# Patient Record
Sex: Female | Born: 1940
Health system: Southern US, Community
[De-identification: ages and names within clinical notes are randomized; demographics above are authoritative.]

## PROBLEM LIST (undated history)

## (undated) DIAGNOSIS — I10 Essential (primary) hypertension: Secondary | ICD-10-CM

## (undated) DIAGNOSIS — E7800 Pure hypercholesterolemia, unspecified: Secondary | ICD-10-CM

## (undated) DIAGNOSIS — I509 Heart failure, unspecified: Secondary | ICD-10-CM

---

## 2001-07-11 ENCOUNTER — Inpatient Hospital Stay (HOSPITAL_COMMUNITY): Admission: EM | Admit: 2001-07-11 | Discharge: 2001-07-12 | Payer: Self-pay | Admitting: Cardiology

## 2011-12-02 DIAGNOSIS — B0222 Postherpetic trigeminal neuralgia: Secondary | ICD-10-CM | POA: Diagnosis not present

## 2011-12-02 DIAGNOSIS — B029 Zoster without complications: Secondary | ICD-10-CM | POA: Diagnosis not present

## 2012-03-01 DIAGNOSIS — B0222 Postherpetic trigeminal neuralgia: Secondary | ICD-10-CM | POA: Diagnosis not present

## 2012-06-04 DIAGNOSIS — B0222 Postherpetic trigeminal neuralgia: Secondary | ICD-10-CM | POA: Diagnosis not present

## 2012-09-06 DIAGNOSIS — B0222 Postherpetic trigeminal neuralgia: Secondary | ICD-10-CM | POA: Diagnosis not present

## 2012-12-24 DIAGNOSIS — B0222 Postherpetic trigeminal neuralgia: Secondary | ICD-10-CM | POA: Diagnosis not present

## 2013-03-25 DIAGNOSIS — E669 Obesity, unspecified: Secondary | ICD-10-CM | POA: Diagnosis not present

## 2013-03-25 DIAGNOSIS — B029 Zoster without complications: Secondary | ICD-10-CM | POA: Diagnosis not present

## 2013-07-01 DIAGNOSIS — E782 Mixed hyperlipidemia: Secondary | ICD-10-CM | POA: Diagnosis not present

## 2013-09-30 DIAGNOSIS — Z0000 Encounter for general adult medical examination without abnormal findings: Secondary | ICD-10-CM | POA: Diagnosis not present

## 2013-09-30 DIAGNOSIS — E782 Mixed hyperlipidemia: Secondary | ICD-10-CM | POA: Diagnosis not present

## 2014-01-02 DIAGNOSIS — E782 Mixed hyperlipidemia: Secondary | ICD-10-CM | POA: Diagnosis not present

## 2014-04-04 DIAGNOSIS — E782 Mixed hyperlipidemia: Secondary | ICD-10-CM | POA: Diagnosis not present

## 2014-07-10 DIAGNOSIS — E782 Mixed hyperlipidemia: Secondary | ICD-10-CM | POA: Diagnosis not present

## 2014-11-10 DIAGNOSIS — Z1389 Encounter for screening for other disorder: Secondary | ICD-10-CM | POA: Diagnosis not present

## 2014-11-10 DIAGNOSIS — E784 Other hyperlipidemia: Secondary | ICD-10-CM | POA: Diagnosis not present

## 2014-11-10 DIAGNOSIS — Z0000 Encounter for general adult medical examination without abnormal findings: Secondary | ICD-10-CM | POA: Diagnosis not present

## 2015-02-12 DIAGNOSIS — E784 Other hyperlipidemia: Secondary | ICD-10-CM | POA: Diagnosis not present

## 2015-05-15 DIAGNOSIS — E784 Other hyperlipidemia: Secondary | ICD-10-CM | POA: Diagnosis not present

## 2015-08-17 DIAGNOSIS — I10 Essential (primary) hypertension: Secondary | ICD-10-CM | POA: Diagnosis not present

## 2015-08-17 DIAGNOSIS — E784 Other hyperlipidemia: Secondary | ICD-10-CM | POA: Diagnosis not present

## 2015-11-19 DIAGNOSIS — Z0000 Encounter for general adult medical examination without abnormal findings: Secondary | ICD-10-CM | POA: Diagnosis not present

## 2015-11-19 DIAGNOSIS — Z1389 Encounter for screening for other disorder: Secondary | ICD-10-CM | POA: Diagnosis not present

## 2015-11-19 DIAGNOSIS — E784 Other hyperlipidemia: Secondary | ICD-10-CM | POA: Diagnosis not present

## 2015-11-19 DIAGNOSIS — I10 Essential (primary) hypertension: Secondary | ICD-10-CM | POA: Diagnosis not present

## 2015-11-19 DIAGNOSIS — Z131 Encounter for screening for diabetes mellitus: Secondary | ICD-10-CM | POA: Diagnosis not present

## 2016-02-18 DIAGNOSIS — E784 Other hyperlipidemia: Secondary | ICD-10-CM | POA: Diagnosis not present

## 2016-02-18 DIAGNOSIS — B0223 Postherpetic polyneuropathy: Secondary | ICD-10-CM | POA: Diagnosis not present

## 2016-02-18 DIAGNOSIS — I10 Essential (primary) hypertension: Secondary | ICD-10-CM | POA: Diagnosis not present

## 2016-05-23 DIAGNOSIS — E784 Other hyperlipidemia: Secondary | ICD-10-CM | POA: Diagnosis not present

## 2016-05-23 DIAGNOSIS — Z131 Encounter for screening for diabetes mellitus: Secondary | ICD-10-CM | POA: Diagnosis not present

## 2016-05-23 DIAGNOSIS — I10 Essential (primary) hypertension: Secondary | ICD-10-CM | POA: Diagnosis not present

## 2016-05-23 DIAGNOSIS — B0223 Postherpetic polyneuropathy: Secondary | ICD-10-CM | POA: Diagnosis not present

## 2016-08-22 DIAGNOSIS — E784 Other hyperlipidemia: Secondary | ICD-10-CM | POA: Diagnosis not present

## 2016-08-22 DIAGNOSIS — B0223 Postherpetic polyneuropathy: Secondary | ICD-10-CM | POA: Diagnosis not present

## 2016-08-22 DIAGNOSIS — I10 Essential (primary) hypertension: Secondary | ICD-10-CM | POA: Diagnosis not present

## 2016-08-22 DIAGNOSIS — N181 Chronic kidney disease, stage 1: Secondary | ICD-10-CM | POA: Diagnosis not present

## 2016-11-24 DIAGNOSIS — I10 Essential (primary) hypertension: Secondary | ICD-10-CM | POA: Diagnosis not present

## 2016-11-24 DIAGNOSIS — E784 Other hyperlipidemia: Secondary | ICD-10-CM | POA: Diagnosis not present

## 2016-11-24 DIAGNOSIS — Z1389 Encounter for screening for other disorder: Secondary | ICD-10-CM | POA: Diagnosis not present

## 2016-11-24 DIAGNOSIS — Z0000 Encounter for general adult medical examination without abnormal findings: Secondary | ICD-10-CM | POA: Diagnosis not present

## 2016-11-24 DIAGNOSIS — B0223 Postherpetic polyneuropathy: Secondary | ICD-10-CM | POA: Diagnosis not present

## 2016-11-24 DIAGNOSIS — N181 Chronic kidney disease, stage 1: Secondary | ICD-10-CM | POA: Diagnosis not present

## 2017-02-23 DIAGNOSIS — B0223 Postherpetic polyneuropathy: Secondary | ICD-10-CM | POA: Diagnosis not present

## 2017-02-23 DIAGNOSIS — N181 Chronic kidney disease, stage 1: Secondary | ICD-10-CM | POA: Diagnosis not present

## 2017-02-23 DIAGNOSIS — E784 Other hyperlipidemia: Secondary | ICD-10-CM | POA: Diagnosis not present

## 2017-02-23 DIAGNOSIS — I10 Essential (primary) hypertension: Secondary | ICD-10-CM | POA: Diagnosis not present

## 2017-05-29 DIAGNOSIS — N181 Chronic kidney disease, stage 1: Secondary | ICD-10-CM | POA: Diagnosis not present

## 2017-05-29 DIAGNOSIS — I10 Essential (primary) hypertension: Secondary | ICD-10-CM | POA: Diagnosis not present

## 2017-05-29 DIAGNOSIS — B0223 Postherpetic polyneuropathy: Secondary | ICD-10-CM | POA: Diagnosis not present

## 2017-05-29 DIAGNOSIS — E784 Other hyperlipidemia: Secondary | ICD-10-CM | POA: Diagnosis not present

## 2017-08-31 DIAGNOSIS — E7849 Other hyperlipidemia: Secondary | ICD-10-CM | POA: Diagnosis not present

## 2017-08-31 DIAGNOSIS — B0223 Postherpetic polyneuropathy: Secondary | ICD-10-CM | POA: Diagnosis not present

## 2017-08-31 DIAGNOSIS — I10 Essential (primary) hypertension: Secondary | ICD-10-CM | POA: Diagnosis not present

## 2017-08-31 DIAGNOSIS — N181 Chronic kidney disease, stage 1: Secondary | ICD-10-CM | POA: Diagnosis not present

## 2017-12-07 DIAGNOSIS — I10 Essential (primary) hypertension: Secondary | ICD-10-CM | POA: Diagnosis not present

## 2017-12-07 DIAGNOSIS — B0223 Postherpetic polyneuropathy: Secondary | ICD-10-CM | POA: Diagnosis not present

## 2017-12-07 DIAGNOSIS — E7849 Other hyperlipidemia: Secondary | ICD-10-CM | POA: Diagnosis not present

## 2017-12-07 DIAGNOSIS — N181 Chronic kidney disease, stage 1: Secondary | ICD-10-CM | POA: Diagnosis not present

## 2018-03-13 DIAGNOSIS — E7849 Other hyperlipidemia: Secondary | ICD-10-CM | POA: Diagnosis not present

## 2018-03-13 DIAGNOSIS — B0223 Postherpetic polyneuropathy: Secondary | ICD-10-CM | POA: Diagnosis not present

## 2018-03-13 DIAGNOSIS — I10 Essential (primary) hypertension: Secondary | ICD-10-CM | POA: Diagnosis not present

## 2018-03-13 DIAGNOSIS — N181 Chronic kidney disease, stage 1: Secondary | ICD-10-CM | POA: Diagnosis not present

## 2018-06-27 DIAGNOSIS — E7849 Other hyperlipidemia: Secondary | ICD-10-CM | POA: Diagnosis not present

## 2018-06-27 DIAGNOSIS — N181 Chronic kidney disease, stage 1: Secondary | ICD-10-CM | POA: Diagnosis not present

## 2018-06-27 DIAGNOSIS — Z0000 Encounter for general adult medical examination without abnormal findings: Secondary | ICD-10-CM | POA: Diagnosis not present

## 2018-06-27 DIAGNOSIS — I10 Essential (primary) hypertension: Secondary | ICD-10-CM | POA: Diagnosis not present

## 2018-06-27 DIAGNOSIS — Z1389 Encounter for screening for other disorder: Secondary | ICD-10-CM | POA: Diagnosis not present

## 2018-10-01 DIAGNOSIS — E7849 Other hyperlipidemia: Secondary | ICD-10-CM | POA: Diagnosis not present

## 2018-10-01 DIAGNOSIS — Z1389 Encounter for screening for other disorder: Secondary | ICD-10-CM | POA: Diagnosis not present

## 2018-10-01 DIAGNOSIS — N181 Chronic kidney disease, stage 1: Secondary | ICD-10-CM | POA: Diagnosis not present

## 2018-10-01 DIAGNOSIS — I10 Essential (primary) hypertension: Secondary | ICD-10-CM | POA: Diagnosis not present

## 2018-10-01 DIAGNOSIS — Z0000 Encounter for general adult medical examination without abnormal findings: Secondary | ICD-10-CM | POA: Diagnosis not present

## 2018-10-26 DIAGNOSIS — W19XXXA Unspecified fall, initial encounter: Secondary | ICD-10-CM | POA: Diagnosis not present

## 2018-10-26 DIAGNOSIS — R531 Weakness: Secondary | ICD-10-CM | POA: Diagnosis not present

## 2018-10-26 DIAGNOSIS — R918 Other nonspecific abnormal finding of lung field: Secondary | ICD-10-CM | POA: Diagnosis not present

## 2018-10-26 DIAGNOSIS — J189 Pneumonia, unspecified organism: Secondary | ICD-10-CM | POA: Diagnosis not present

## 2018-10-26 DIAGNOSIS — R509 Fever, unspecified: Secondary | ICD-10-CM | POA: Diagnosis not present

## 2018-10-26 DIAGNOSIS — R000 Tachycardia, unspecified: Secondary | ICD-10-CM | POA: Diagnosis not present

## 2018-10-26 DIAGNOSIS — T699XXA Effect of reduced temperature, unspecified, initial encounter: Secondary | ICD-10-CM | POA: Diagnosis not present

## 2018-10-27 DIAGNOSIS — I110 Hypertensive heart disease with heart failure: Secondary | ICD-10-CM | POA: Diagnosis not present

## 2018-10-27 DIAGNOSIS — N179 Acute kidney failure, unspecified: Secondary | ICD-10-CM | POA: Diagnosis not present

## 2018-10-27 DIAGNOSIS — G629 Polyneuropathy, unspecified: Secondary | ICD-10-CM | POA: Diagnosis present

## 2018-10-27 DIAGNOSIS — R918 Other nonspecific abnormal finding of lung field: Secondary | ICD-10-CM | POA: Diagnosis not present

## 2018-10-27 DIAGNOSIS — N390 Urinary tract infection, site not specified: Secondary | ICD-10-CM | POA: Diagnosis not present

## 2018-10-27 DIAGNOSIS — Z780 Asymptomatic menopausal state: Secondary | ICD-10-CM | POA: Diagnosis not present

## 2018-10-27 DIAGNOSIS — J1529 Pneumonia due to other staphylococcus: Secondary | ICD-10-CM | POA: Diagnosis not present

## 2018-10-27 DIAGNOSIS — J90 Pleural effusion, not elsewhere classified: Secondary | ICD-10-CM | POA: Diagnosis not present

## 2018-10-27 DIAGNOSIS — I5031 Acute diastolic (congestive) heart failure: Secondary | ICD-10-CM | POA: Diagnosis not present

## 2018-10-27 DIAGNOSIS — Z79899 Other long term (current) drug therapy: Secondary | ICD-10-CM | POA: Diagnosis not present

## 2018-10-27 DIAGNOSIS — Z6837 Body mass index (BMI) 37.0-37.9, adult: Secondary | ICD-10-CM | POA: Diagnosis not present

## 2018-10-27 DIAGNOSIS — J189 Pneumonia, unspecified organism: Secondary | ICD-10-CM | POA: Diagnosis not present

## 2018-10-27 DIAGNOSIS — W19XXXA Unspecified fall, initial encounter: Secondary | ICD-10-CM | POA: Diagnosis not present

## 2018-10-27 DIAGNOSIS — E871 Hypo-osmolality and hyponatremia: Secondary | ICD-10-CM | POA: Diagnosis not present

## 2018-10-27 DIAGNOSIS — R531 Weakness: Secondary | ICD-10-CM | POA: Diagnosis present

## 2018-10-27 DIAGNOSIS — E785 Hyperlipidemia, unspecified: Secondary | ICD-10-CM | POA: Diagnosis present

## 2018-11-08 DIAGNOSIS — I110 Hypertensive heart disease with heart failure: Secondary | ICD-10-CM | POA: Diagnosis not present

## 2018-11-08 DIAGNOSIS — Z7982 Long term (current) use of aspirin: Secondary | ICD-10-CM | POA: Diagnosis not present

## 2018-11-08 DIAGNOSIS — I509 Heart failure, unspecified: Secondary | ICD-10-CM | POA: Diagnosis not present

## 2018-11-08 DIAGNOSIS — I5031 Acute diastolic (congestive) heart failure: Secondary | ICD-10-CM | POA: Diagnosis not present

## 2018-11-08 DIAGNOSIS — M6281 Muscle weakness (generalized): Secondary | ICD-10-CM | POA: Diagnosis not present

## 2018-11-08 DIAGNOSIS — N390 Urinary tract infection, site not specified: Secondary | ICD-10-CM | POA: Diagnosis not present

## 2018-11-08 DIAGNOSIS — J158 Pneumonia due to other specified bacteria: Secondary | ICD-10-CM | POA: Diagnosis not present

## 2018-11-08 DIAGNOSIS — J1520 Pneumonia due to staphylococcus, unspecified: Secondary | ICD-10-CM | POA: Diagnosis not present

## 2018-11-12 DIAGNOSIS — M6281 Muscle weakness (generalized): Secondary | ICD-10-CM | POA: Diagnosis not present

## 2018-11-12 DIAGNOSIS — I110 Hypertensive heart disease with heart failure: Secondary | ICD-10-CM | POA: Diagnosis not present

## 2018-11-12 DIAGNOSIS — N390 Urinary tract infection, site not specified: Secondary | ICD-10-CM | POA: Diagnosis not present

## 2018-11-12 DIAGNOSIS — J1520 Pneumonia due to staphylococcus, unspecified: Secondary | ICD-10-CM | POA: Diagnosis not present

## 2018-11-12 DIAGNOSIS — I509 Heart failure, unspecified: Secondary | ICD-10-CM | POA: Diagnosis not present

## 2018-11-12 DIAGNOSIS — Z7982 Long term (current) use of aspirin: Secondary | ICD-10-CM | POA: Diagnosis not present

## 2018-11-14 DIAGNOSIS — I509 Heart failure, unspecified: Secondary | ICD-10-CM | POA: Diagnosis not present

## 2018-11-14 DIAGNOSIS — M6281 Muscle weakness (generalized): Secondary | ICD-10-CM | POA: Diagnosis not present

## 2018-11-14 DIAGNOSIS — Z7982 Long term (current) use of aspirin: Secondary | ICD-10-CM | POA: Diagnosis not present

## 2018-11-14 DIAGNOSIS — N390 Urinary tract infection, site not specified: Secondary | ICD-10-CM | POA: Diagnosis not present

## 2018-11-14 DIAGNOSIS — J1520 Pneumonia due to staphylococcus, unspecified: Secondary | ICD-10-CM | POA: Diagnosis not present

## 2018-11-14 DIAGNOSIS — I110 Hypertensive heart disease with heart failure: Secondary | ICD-10-CM | POA: Diagnosis not present

## 2018-11-19 DIAGNOSIS — J1520 Pneumonia due to staphylococcus, unspecified: Secondary | ICD-10-CM | POA: Diagnosis not present

## 2018-11-19 DIAGNOSIS — I110 Hypertensive heart disease with heart failure: Secondary | ICD-10-CM | POA: Diagnosis not present

## 2018-11-19 DIAGNOSIS — N390 Urinary tract infection, site not specified: Secondary | ICD-10-CM | POA: Diagnosis not present

## 2018-11-19 DIAGNOSIS — I509 Heart failure, unspecified: Secondary | ICD-10-CM | POA: Diagnosis not present

## 2018-11-19 DIAGNOSIS — Z7982 Long term (current) use of aspirin: Secondary | ICD-10-CM | POA: Diagnosis not present

## 2018-11-19 DIAGNOSIS — M6281 Muscle weakness (generalized): Secondary | ICD-10-CM | POA: Diagnosis not present

## 2018-11-22 DIAGNOSIS — J1520 Pneumonia due to staphylococcus, unspecified: Secondary | ICD-10-CM | POA: Diagnosis not present

## 2018-11-22 DIAGNOSIS — Z7982 Long term (current) use of aspirin: Secondary | ICD-10-CM | POA: Diagnosis not present

## 2018-11-22 DIAGNOSIS — M6281 Muscle weakness (generalized): Secondary | ICD-10-CM | POA: Diagnosis not present

## 2018-11-22 DIAGNOSIS — I110 Hypertensive heart disease with heart failure: Secondary | ICD-10-CM | POA: Diagnosis not present

## 2018-11-22 DIAGNOSIS — I509 Heart failure, unspecified: Secondary | ICD-10-CM | POA: Diagnosis not present

## 2018-11-22 DIAGNOSIS — N390 Urinary tract infection, site not specified: Secondary | ICD-10-CM | POA: Diagnosis not present

## 2018-11-30 DIAGNOSIS — R0689 Other abnormalities of breathing: Secondary | ICD-10-CM | POA: Diagnosis not present

## 2018-11-30 DIAGNOSIS — R0902 Hypoxemia: Secondary | ICD-10-CM | POA: Diagnosis not present

## 2018-11-30 DIAGNOSIS — I959 Hypotension, unspecified: Secondary | ICD-10-CM | POA: Diagnosis not present

## 2018-11-30 DIAGNOSIS — R000 Tachycardia, unspecified: Secondary | ICD-10-CM | POA: Diagnosis not present

## 2018-11-30 DIAGNOSIS — R55 Syncope and collapse: Secondary | ICD-10-CM | POA: Diagnosis not present

## 2018-12-01 ENCOUNTER — Other Ambulatory Visit: Payer: Self-pay

## 2018-12-01 ENCOUNTER — Observation Stay (HOSPITAL_COMMUNITY): Payer: Medicare Other

## 2018-12-01 ENCOUNTER — Encounter (HOSPITAL_COMMUNITY): Payer: Self-pay

## 2018-12-01 ENCOUNTER — Observation Stay (HOSPITAL_COMMUNITY)
Admission: EM | Admit: 2018-12-01 | Discharge: 2018-12-02 | Disposition: A | Discharge status: Home / Self Care | Payer: Medicare Other | Attending: Internal Medicine | Admitting: Internal Medicine

## 2018-12-01 ENCOUNTER — Emergency Department (HOSPITAL_COMMUNITY): Payer: Medicare Other

## 2018-12-01 DIAGNOSIS — R55 Syncope and collapse: Secondary | ICD-10-CM | POA: Diagnosis not present

## 2018-12-01 DIAGNOSIS — Z7982 Long term (current) use of aspirin: Secondary | ICD-10-CM | POA: Diagnosis not present

## 2018-12-01 DIAGNOSIS — Z79899 Other long term (current) drug therapy: Secondary | ICD-10-CM | POA: Insufficient documentation

## 2018-12-01 DIAGNOSIS — N179 Acute kidney failure, unspecified: Secondary | ICD-10-CM | POA: Diagnosis present

## 2018-12-01 DIAGNOSIS — E860 Dehydration: Secondary | ICD-10-CM | POA: Insufficient documentation

## 2018-12-01 DIAGNOSIS — I110 Hypertensive heart disease with heart failure: Secondary | ICD-10-CM | POA: Diagnosis not present

## 2018-12-01 DIAGNOSIS — E785 Hyperlipidemia, unspecified: Secondary | ICD-10-CM | POA: Diagnosis not present

## 2018-12-01 DIAGNOSIS — I10 Essential (primary) hypertension: Secondary | ICD-10-CM | POA: Diagnosis present

## 2018-12-01 DIAGNOSIS — E875 Hyperkalemia: Secondary | ICD-10-CM | POA: Diagnosis not present

## 2018-12-01 DIAGNOSIS — I509 Heart failure, unspecified: Secondary | ICD-10-CM | POA: Insufficient documentation

## 2018-12-01 DIAGNOSIS — E7800 Pure hypercholesterolemia, unspecified: Secondary | ICD-10-CM | POA: Insufficient documentation

## 2018-12-01 DIAGNOSIS — R1110 Vomiting, unspecified: Secondary | ICD-10-CM | POA: Diagnosis present

## 2018-12-01 DIAGNOSIS — I959 Hypotension, unspecified: Secondary | ICD-10-CM | POA: Diagnosis not present

## 2018-12-01 DIAGNOSIS — R2681 Unsteadiness on feet: Secondary | ICD-10-CM | POA: Diagnosis not present

## 2018-12-01 DIAGNOSIS — Z23 Encounter for immunization: Secondary | ICD-10-CM | POA: Diagnosis not present

## 2018-12-01 HISTORY — DX: Essential (primary) hypertension: I10

## 2018-12-01 HISTORY — DX: Pure hypercholesterolemia, unspecified: E78.00

## 2018-12-01 HISTORY — DX: Heart failure, unspecified: I50.9

## 2018-12-01 LAB — URINALYSIS, ROUTINE W REFLEX MICROSCOPIC
Bilirubin Urine: NEGATIVE
Glucose, UA: NEGATIVE mg/dL
Ketones, ur: NEGATIVE mg/dL
Nitrite: NEGATIVE
Protein, ur: NEGATIVE mg/dL
Specific Gravity, Urine: 1.005 (ref 1.005–1.030)
Trans Epithel, UA: 2
pH: 5 (ref 5.0–8.0)

## 2018-12-01 LAB — COMPREHENSIVE METABOLIC PANEL
ALK PHOS: 103 U/L (ref 38–126)
ALT: 18 U/L (ref 0–44)
AST: 46 U/L — ABNORMAL HIGH (ref 15–41)
Albumin: 3.1 g/dL — ABNORMAL LOW (ref 3.5–5.0)
Anion gap: 13 (ref 5–15)
BUN: 49 mg/dL — ABNORMAL HIGH (ref 8–23)
CO2: 21 mmol/L — ABNORMAL LOW (ref 22–32)
Calcium: 8.6 mg/dL — ABNORMAL LOW (ref 8.9–10.3)
Chloride: 94 mmol/L — ABNORMAL LOW (ref 98–111)
Creatinine, Ser: 3.65 mg/dL — ABNORMAL HIGH (ref 0.44–1.00)
GFR calc Af Amer: 13 mL/min — ABNORMAL LOW (ref >60)
GFR calc non Af Amer: 11 mL/min — ABNORMAL LOW (ref >60)
Glucose, Bld: 115 mg/dL — ABNORMAL HIGH (ref 70–99)
Potassium: 6.2 mmol/L — ABNORMAL HIGH (ref 3.5–5.1)
Sodium: 128 mmol/L — ABNORMAL LOW (ref 135–145)
Total Bilirubin: 1.6 mg/dL — ABNORMAL HIGH (ref 0.3–1.2)
Total Protein: 6.5 g/dL (ref 6.5–8.1)

## 2018-12-01 LAB — BASIC METABOLIC PANEL
Anion gap: 10 (ref 5–15)
BUN: 45 mg/dL — ABNORMAL HIGH (ref 8–23)
CO2: 19 mmol/L — ABNORMAL LOW (ref 22–32)
Calcium: 8.1 mg/dL — ABNORMAL LOW (ref 8.9–10.3)
Chloride: 101 mmol/L (ref 98–111)
Creatinine, Ser: 3.28 mg/dL — ABNORMAL HIGH (ref 0.44–1.00)
GFR calc Af Amer: 15 mL/min — ABNORMAL LOW (ref >60)
GFR calc non Af Amer: 13 mL/min — ABNORMAL LOW (ref >60)
GLUCOSE: 100 mg/dL — AB (ref 70–99)
Potassium: 5.9 mmol/L — ABNORMAL HIGH (ref 3.5–5.1)
Sodium: 130 mmol/L — ABNORMAL LOW (ref 135–145)

## 2018-12-01 LAB — CBC WITH DIFFERENTIAL/PLATELET
ABS IMMATURE GRANULOCYTES: 0.05 10*3/uL (ref 0.00–0.07)
Basophils Absolute: 0 10*3/uL (ref 0.0–0.1)
Basophils Relative: 0 %
Eosinophils Absolute: 0.4 10*3/uL (ref 0.0–0.5)
Eosinophils Relative: 6 %
HCT: 33.3 % — ABNORMAL LOW (ref 36.0–46.0)
Hemoglobin: 10.7 g/dL — ABNORMAL LOW (ref 12.0–15.0)
Immature Granulocytes: 1 %
Lymphocytes Relative: 15 %
Lymphs Abs: 1.1 10*3/uL (ref 0.7–4.0)
MCH: 29.4 pg (ref 26.0–34.0)
MCHC: 32.1 g/dL (ref 30.0–36.0)
MCV: 91.5 fL (ref 80.0–100.0)
MONO ABS: 0.8 10*3/uL (ref 0.1–1.0)
Monocytes Relative: 11 %
NEUTROS ABS: 4.7 10*3/uL (ref 1.7–7.7)
Neutrophils Relative %: 67 %
Platelets: 231 10*3/uL (ref 150–400)
RBC: 3.64 MIL/uL — ABNORMAL LOW (ref 3.87–5.11)
RDW: 13.3 % (ref 11.5–15.5)
WBC: 7.1 10*3/uL (ref 4.0–10.5)
nRBC: 0 % (ref 0.0–0.2)

## 2018-12-01 LAB — I-STAT TROPONIN, ED: Troponin i, poc: 0.02 ng/mL (ref 0.00–0.08)

## 2018-12-01 LAB — POTASSIUM: Potassium: 5.6 mmol/L — ABNORMAL HIGH (ref 3.5–5.1)

## 2018-12-01 MED ORDER — INFLUENZA VAC SPLIT HIGH-DOSE 0.5 ML IM SUSY
0.5000 mL | PREFILLED_SYRINGE | INTRAMUSCULAR | Status: AC
  Administered 2018-12-02: 0.5 mL via INTRAMUSCULAR
  Filled 2018-12-01: qty 0.5

## 2018-12-01 MED ORDER — POLYETHYLENE GLYCOL 3350 17 G PO PACK
17.0000 g | PACK | Freq: Two times a day (BID) | ORAL | Status: DC
  Filled 2018-12-01: qty 1

## 2018-12-01 MED ORDER — ATORVASTATIN CALCIUM 10 MG PO TABS
20.0000 mg | ORAL_TABLET | Freq: Every day | ORAL | Status: DC
  Administered 2018-12-01: 20 mg via ORAL
  Filled 2018-12-01: qty 2

## 2018-12-01 MED ORDER — ONDANSETRON HCL 4 MG/2ML IJ SOLN: 4.0000 mg | Freq: Four times a day (QID) | INTRAMUSCULAR | Status: DC | PRN

## 2018-12-01 MED ORDER — ASPIRIN 81 MG PO CHEW
81.0000 mg | CHEWABLE_TABLET | Freq: Every day | ORAL | Status: DC
  Administered 2018-12-01 – 2018-12-02 (×2): 81 mg via ORAL
  Filled 2018-12-01 (×2): qty 1

## 2018-12-01 MED ORDER — NYSTATIN 100000 UNIT/GM EX POWD
Freq: Three times a day (TID) | CUTANEOUS | Status: DC
  Administered 2018-12-01 – 2018-12-02 (×2): via TOPICAL
  Filled 2018-12-01: qty 15

## 2018-12-01 MED ORDER — HEPARIN SODIUM (PORCINE) 5000 UNIT/ML IJ SOLN
5000.0000 [IU] | Freq: Three times a day (TID) | INTRAMUSCULAR | Status: DC
  Administered 2018-12-01 – 2018-12-02 (×3): 5000 [IU] via SUBCUTANEOUS
  Filled 2018-12-01 (×3): qty 1

## 2018-12-01 MED ORDER — SODIUM POLYSTYRENE SULFONATE 15 GM/60ML PO SUSP
30.0000 g | Freq: Once | ORAL | Status: AC
  Administered 2018-12-01: 30 g via ORAL
  Filled 2018-12-01: qty 120

## 2018-12-01 MED ORDER — SODIUM CHLORIDE 0.9 % IV SOLN
INTRAVENOUS | Status: DC
  Administered 2018-12-01: 07:00:00 via INTRAVENOUS

## 2018-12-01 MED ORDER — LACTATED RINGERS IV SOLN
INTRAVENOUS | Status: AC
  Administered 2018-12-01 (×2): via INTRAVENOUS

## 2018-12-01 MED ORDER — ACETAMINOPHEN 325 MG PO TABS: 650.0000 mg | ORAL_TABLET | Freq: Four times a day (QID) | ORAL | Status: DC | PRN

## 2018-12-01 MED ORDER — PNEUMOCOCCAL VAC POLYVALENT 25 MCG/0.5ML IJ INJ
0.5000 mL | INJECTION | INTRAMUSCULAR | Status: AC
  Administered 2018-12-02: 0.5 mL via INTRAMUSCULAR
  Filled 2018-12-01: qty 0.5

## 2018-12-01 MED ORDER — ALBUTEROL SULFATE (2.5 MG/3ML) 0.083% IN NEBU
5.0000 mg | INHALATION_SOLUTION | Freq: Once | RESPIRATORY_TRACT | Status: AC
  Administered 2018-12-01: 5 mg via RESPIRATORY_TRACT
  Filled 2018-12-01: qty 6

## 2018-12-01 MED ORDER — CARVEDILOL 3.125 MG PO TABS
3.1250 mg | ORAL_TABLET | Freq: Two times a day (BID) | ORAL | Status: DC
  Administered 2018-12-01 – 2018-12-02 (×3): 3.125 mg via ORAL
  Filled 2018-12-01 (×4): qty 1

## 2018-12-01 MED ORDER — SODIUM CHLORIDE 0.9 % IV BOLUS (SEPSIS)
500.0000 mL | Freq: Once | INTRAVENOUS | Status: AC
  Administered 2018-12-01: 500 mL via INTRAVENOUS

## 2018-12-01 MED ORDER — IPRATROPIUM-ALBUTEROL 0.5-2.5 (3) MG/3ML IN SOLN: 3.0000 mL | Freq: Four times a day (QID) | RESPIRATORY_TRACT | Status: DC | PRN

## 2018-12-01 MED ORDER — SODIUM CHLORIDE 0.9 % IV BOLUS (SEPSIS)
1000.0000 mL | Freq: Once | INTRAVENOUS | Status: AC
  Administered 2018-12-01: 1000 mL via INTRAVENOUS

## 2018-12-01 NOTE — ED Notes (Signed)
Patient transported to Ultrasound 

## 2018-12-01 NOTE — ED Notes (Signed)
Patient had another large bowel movement. She is refusing her Miralax at this moment

## 2018-12-01 NOTE — ED Notes (Signed)
Patient ambulated to the bathroom-standby assist

## 2018-12-01 NOTE — ED Notes (Signed)
Patient had a large  bowel movement

## 2018-12-01 NOTE — ED Notes (Signed)
Tabitha (SR)Lunch Tray Ordered @ 0956-per RN-called by Nimrod Wendt  

## 2018-12-01 NOTE — Progress Notes (Signed)
New admission arrived to the unit, placed on telemetry. Patient is alert and oriented x4 and ambulatory. Patient lives at home with room mate and used no assistive devices. Orthostatics performed on admission to the unit and patient was positive.  Small skin rash to left lower abdomen and mid back.. Patient also has redness present under both breast. Nystatin powder ordered. Patient has IVF infusing. She was explained unit protocol and patient handout/ welcome kit received. Bed alarm set R/T +orthostatics, Call bell within reach and room mate at beside

## 2018-12-01 NOTE — ED Triage Notes (Signed)
Pt BIB [facility] d/t witnessed syncopal episode in chair. Pt was hypOtensive 78/40 by EMS. EMS gave 150 cc NSB. Pt hx Pneumonia last month

## 2018-12-01 NOTE — ED Provider Notes (Signed)
TIME SEEN: 1:16 AM  CHIEF COMPLAINT: Syncope  HPI: Patient is a 78 year old female with history of hypertension who presents to the emergency department with a syncopal event that occurred just prior to arrival.  Per her roommate, patient had an episode of unresponsiveness while sitting in her chair tonight.  This lasted for approximately 5 minutes with no seizure activity.  Patient states that she could hear her roommate talking to her and thought she was responding but the roommate said she never open her eyes, follow commands or answer questions.  No incontinence or tongue biting.  Patient denies any recent fevers, chest pain, shortness of breath, cough, diarrhea, bloody stools or melena.  Did have one episode of vomiting tonight.  States she was recently admitted to [facility] the beginning of January for pneumonia.  Was found to be hypotensive with EMS with systolic blood pressures in the 70s.  They have been giving IV fluids in route.  ROS: See HPI Constitutional: no fever  Eyes: no drainage  ENT: no runny nose   Cardiovascular:  no chest pain  Resp: no SOB  GI: no vomiting GU: no dysuria Integumentary: no rash  Allergy: no hives  Musculoskeletal: no leg swelling  Neurological: no slurred speech ROS otherwise negative  PAST MEDICAL HISTORY/PAST SURGICAL HISTORY:  HTN  MEDICATIONS:  Prior to Admission medications   Not on File    ALLERGIES:  Not on File  SOCIAL HISTORY:  Social History   Tobacco Use  . Smoking status: Never Smoker  . Smokeless tobacco: Never Used  Substance Use Topics  . Alcohol use: Never    Frequency: Never    FAMILY HISTORY: History reviewed. No pertinent family history.  EXAM: BP (!) 106/45 (BP Location: Left Arm)   Pulse 68   Temp (!) 97.3 F (36.3 C) (Oral)   Resp 14   Ht 5' (1.524 m)   Wt 78.9 kg   SpO2 95%   BMI 33.98 kg/m  CONSTITUTIONAL: Alert and oriented x 3 and responds appropriately to questions. Well-appearing;  well-nourished, elderly HEAD: Normocephalic EYES: Conjunctivae clear, pupils appear equal, EOMI ENT: normal nose; moist mucous membranes NECK: Supple, no meningismus, no nuchal rigidity, no LAD  CARD: RRR; S1 and S2 appreciated; no murmurs, no clicks, no rubs, no gallops RESP: Normal chest excursion without splinting or tachypnea; breath sounds clear and equal bilaterally; no wheezes, no rhonchi, no rales, no hypoxia or respiratory distress, speaking full sentences ABD/GI: Normal bowel sounds; non-distended; soft, non-tender, no rebound, no guarding, no peritoneal signs, no hepatosplenomegaly BACK:  The back appears normal and is non-tender to palpation, there is no CVA tenderness EXT: Normal ROM in all joints; non-tender to palpation; no edema; normal capillary refill; no cyanosis, no calf tenderness or swelling    SKIN: Normal color for age and race; warm; no rash NEURO: Moves all extremities equally, sensation to light touch intact diffusely, cranial nerves II through XII intact, normal speech PSYCH: The patient's mood and manner are appropriate. Grooming and personal hygiene are appropriate.  MEDICAL DECISION MAKING: Patient here with a syncopal event.  Patient states that she does not think she lost consciousness fully.  Roommate at bedside denies any seizure-like activity.  She is neurologically intact.  She denies any complaints at this time.  No recent infectious symptoms.  Did have one episode of vomiting but otherwise no significant vomiting, diarrhea.  Has been eating and drinking well.  Was hypotensive with EMS but this has improved.  Will continue  IV hydration.  Will check labs for anemia, electrolyte disturbance, ACS.  EKG shows no ischemic abnormality.  Will check urinalysis, chest x-ray to evaluate for signs of infection.  ED PROGRESS: Patient's labs show creatinine of 3.65.  She denies a known history of renal failure.  I suspect that this is new.  Her potassium is 5.6 without EKG  changes.  She is getting IV fluids and will also give albuterol to temporize her hyperkalemia.  Chest x-ray shows no sign of pneumonia.  Urine shows no sign of infection.  Hemoglobin 0.7.  Troponin negative.  I feel patient will need admission.   4:29 AM Discussed patient's case with hospitalist, Dr. Julian Reil.  I have recommended admission and patient (and family if present) agree with this plan. Admitting physician will place admission orders.   I reviewed all nursing notes, vitals, pertinent previous records, EKGs, lab and urine results, imaging (as available).      EKG Interpretation  Date/Time:  Saturday December 01 2018 00:53:17 EST Ventricular Rate:  70 PR Interval:    QRS Duration: 105 QT Interval:  423 QTC Calculation: 457 R Axis:   -13 Text Interpretation:  Sinus rhythm Probable anterior infarct, age indeterminate No significant change since last tracing Confirmed by Benjamim Harnish, Baxter Hire [phone]) on 12/01/2018 4:12:28 AM        CRITICAL CARE Performed by: Baxter Hire Haniya Fern   Total critical care time: 55 minutes  Critical care time was exclusive of separately billable procedures and treating other patients.  Critical care was necessary to treat or prevent imminent or life-threatening deterioration.  Critical care was time spent personally by me on the following activities: development of treatment plan with patient and/or surrogate as well as nursing, discussions with consultants, evaluation of patient's response to treatment, examination of patient, obtaining history from patient or surrogate, ordering and performing treatments and interventions, ordering and review of laboratory studies, ordering and review of radiographic studies, pulse oximetry and re-evaluation of patient's condition.     Clessie Karras, Layla Maw, DO 12/01/18 [phone]

## 2018-12-01 NOTE — H&P (Signed)
TRH H&P   Patient Demographics:    Kathleen Nielsen, is a 78 y.o. female  MRN: [id number]   DOB - January 01, 1941  Admit Date - 12/01/2018  Outpatient Primary MD None listed    Patient coming from: Home  Chief Complaint  Patient presents with  . Loss of Consciousness      HPI:    Kathleen Nielsen  is a 78 y.o. female, 3 of hypertension on diuretic, ACE inhibitor and Coreg, dyslipidemia who was in usual state of health until last night when her daughter-in-law tried to wake her up and she thought she was more drowsy than usual, she was brought to the ER where she was found to be dehydrated and acute renal failure along with hyperkalemia, we were requested to admit her for further care.  Patient currently is strictly symptom-free denies any headache chest pain cough shortness of breath, no diarrhea or dysuria or focal weakness.  Completely symptom-free.    Review of systems:    A full 10 point Review of Systems was done, except as stated above, all other Review of Systems were negative.   With Past History of the following :    Past Medical History:  Diagnosis Date  . CHF (congestive heart failure) (HCC)   . Hypercholesterolemia   . Hypertension       Past Surgical History:  Procedure Laterality Date  . CESAREAN SECTION     x2      Social History:     Social History   Tobacco Use  . Smoking status: Never Smoker  . Smokeless tobacco: Never Used  Substance Use Topics  . Alcohol use: Never    Frequency: Never         Family History :   Family history of dialysis or kidney failure   Home Medications:   Prior to Admission medications   Medication Sig Start Date End Date Taking? Authorizing Provider    aspirin 81 MG chewable tablet Chew 81 mg by mouth daily.   Yes [provider]  atorvastatin (LIPITOR) 20 MG tablet Take 20 mg by mouth daily at 6 PM.   Yes [provider]  benazepril (LOTENSIN) 20 MG tablet Take 20 mg by mouth daily. 11/02/18  Yes [provider]  carvedilol (COREG) 3.125 MG tablet Take 3.125 mg by mouth 2 (  two) times daily. 11/28/18  Yes [provider]  gabapentin (NEURONTIN) 300 MG capsule Take 300 mg by mouth 2 (two) times daily. 10/01/18  Yes [provider]  potassium chloride (K-DUR,KLOR-CON) 10 MEQ tablet Take 10 mEq by mouth daily. 11/22/18  Yes [provider]  torsemide (DEMADEX) 5 MG tablet Take 20 mg by mouth daily. 11/07/18  Yes [provider]     Allergies:    No Known Allergies   Physical Exam:   Vitals  Blood pressure 116/61, pulse 88, temperature (!) 97.3 F (36.3 C), temperature source Oral, resp. rate 18, height 5' (1.524 m), weight 78.9 kg, SpO2 99 %.   1. General elderly white Caucasian female lying in hospital bed in no apparent discomfort,  2. Normal affect and insight, Not Suicidal or Homicidal, Awake Alert, Oriented X 3.  3. No F.N deficits, ALL C.Nerves Intact, Strength 5/5 all 4 extremities, Sensation intact all 4 extremities, Plantars down going.  4. Ears and Eyes appear Normal, Conjunctivae clear, PERRLA. Moist Oral Mucosa.  5. Supple Neck, No JVD, No cervical lymphadenopathy appriciated, No Carotid Bruits.  6. Symmetrical Chest wall movement, Good air movement bilaterally, CTAB.  7. RRR, No Gallops, Rubs or Murmurs, No Parasternal Heave.  8. Positive Bowel Sounds, Abdomen Soft, No tenderness, No organomegaly appriciated,No rebound -guarding or rigidity.  9.  No Cyanosis, Normal Skin Turgor, No Skin Rash or Bruise.  10. Good muscle tone,  joints appear normal , no effusions, Normal ROM.  11. No Palpable Lymph Nodes in Neck or Axillae      Data Review:     CBC Recent Labs  Lab 12/01/18 0115  WBC 7.1  HGB 10.7*  HCT 33.3*  PLT 231  MCV 91.5  MCH 29.4  MCHC 32.1  RDW 13.3  LYMPHSABS 1.1  MONOABS 0.8  EOSABS 0.4  BASOSABS 0.0   ------------------------------------------------------------------------------------------------------------------  Chemistries  Recent Labs  Lab 12/01/18 0115 12/01/18 0227 12/01/18 0659  NA 128*  --  130*  K 6.2* 5.6* 5.9*  CL 94*  --  101  CO2 21*  --  19*  GLUCOSE 115*  --  100*  BUN 49*  --  45*  CREATININE 3.65*  --  3.28*  CALCIUM 8.6*  --  8.1*  AST 46*  --   --   ALT 18  --   --   ALKPHOS 103  --   --   BILITOT 1.6*  --   --    ------------------------------------------------------------------------------------------------------------------ estimated creatinine clearance is 13.1 mL/min (A) (by C-G formula based on SCr of 3.28 mg/dL (H)). ------------------------------------------------------------------------------------------------------------------ No results for input(s): TSH, T4TOTAL, T3FREE, THYROIDAB in the last 72 hours.  Invalid input(s): FREET3  Coagulation profile No results for input(s): INR, PROTIME in the last 168 hours. ------------------------------------------------------------------------------------------------------------------- No results for input(s): DDIMER in the last 72 hours. -------------------------------------------------------------------------------------------------------------------  Cardiac Enzymes No results for input(s): CKMB, TROPONINI, MYOGLOBIN in the last 168 hours.  Invalid input(s): CK ------------------------------------------------------------------------------------------------------------------ No results found for: BNP   ---------------------------------------------------------------------------------------------------------------  Urinalysis    Component Value Date/Time   COLORURINE STRAW (A) 12/01/2018 0354   APPEARANCEUR CLEAR  12/01/2018 0354   LABSPEC 1.005 12/01/2018 0354   PHURINE 5.0 12/01/2018 0354   GLUCOSEU NEGATIVE 12/01/2018 0354   HGBUR SMALL (A) 12/01/2018 0354   BILIRUBINUR NEGATIVE 12/01/2018 0354   KETONESUR NEGATIVE 12/01/2018 0354   PROTEINUR NEGATIVE 12/01/2018 0354   NITRITE NEGATIVE 12/01/2018 0354   LEUKOCYTESUR SMALL (A) 12/01/2018 0354    ----------------------------------------------------------------------------------------------------------------  Imaging Results:    Dg Chest 2 View  Result Date: 12/01/2018 CLINICAL DATA:  Syncope, hypotension EXAM: CHEST - 2 VIEW COMPARISON:  11/02/2018, 10/27/2018 FINDINGS: Cardiomegaly. Interstitial prominence throughout the lungs. This could reflect chronic interstitial lung disease or interstitial edema. No visible significant effusions or acute bony abnormality. IMPRESSION: Cardiomegaly. Diffuse interstitial prominence throughout the lungs, stable dating back to 10/27/2018. This could reflect chronic interstitial lung disease/fibrosis or interstitial edema. Electronically Signed   By: Charlett Nose M.D.   On: 12/01/2018 02:01    My personal review of EKG: Rhythm NSR, no acute ST changes.   Assessment & Plan:      1.  Dehydration induced acute renal failure and hyperkalemia.  Caused by underlying use of diuretics and ACE inhibitor.  Hold both, hydrate, check renal ultrasound, monitor BMP.  Give a dose of Kayexalate.  2.  Hypertension.  Blood pressure stable.  Hold all blood pressure medications hydrate and monitor.  3.  Stable dyslipidemia.  Continue home dose statin.  4. ? history of CHF in the chart.  No echo on file.  Currently dehydrated.  Outpatient follow-up with PCP and primary cardiologist.    DVT Prophylaxis Heparin    AM Labs Ordered, also please review Full Orders  Family Communication: Admission, patients condition and plan of care including tests being ordered have been discussed with the patient and amily who indicate  understanding and agree with the plan and Code Status.  Code Status Full  Likely DC to  Home  Condition GUARDED    Consults called: None    Admission status: Obs    Time spent in minutes : 35   Susa Raring M.D on 12/01/2018 at 12:04 PM  To page go to [email or web address] - password [facility]

## 2018-12-01 NOTE — Evaluation (Addendum)
Physical Therapy Evaluation Patient Details Name: Kathleen Nielsen A Denslow MRN: [id number] DOB: 05-Dec-1940 Today's Date: 12/01/2018   History of Present Illness  Patient is a 78 y/o female who presents with syncopal episode. Admitted with dehydration induced renal failure and hyperkalemia. PMH includes HTN, CHF.   Clinical Impression  Patient presents with mild balance deficits and + orthostasis s/p above. Tolerated bed mobility, transfers and short distance ambulation with close Min guard for safety as pt is orthostatic and asymptomatic. Sitting BP 129/60, standing BP with marching 94/69, asymptomatic. Distance limited due to BP drop and risk of falling out without warning. Getting IV fluid currently, will reassess walking next session. Education re: importance of changing positions slowly and waiting a few mins before walking, safety at home. Pt independent PTA and just finished up HHPT last week. Reports doing daily exercise program. Pt lives with roommate. Encouraged walking while in the hospital. Will follow acutely to maximize independence and mobility prior to return home.     Follow Up Recommendations No PT follow up;Supervision - Intermittent    Equipment Recommendations  None recommended by PT    Recommendations for Other Services       Precautions / Restrictions Precautions Precautions: Fall Precaution Comments: orthostatic Restrictions Weight Bearing Restrictions: No      Mobility  Bed Mobility Overal bed mobility: Needs Assistance Bed Mobility: Supine to Sit     Supine to sit: Supervision;HOB elevated        Transfers Overall transfer level: Needs assistance Equipment used: None Transfers: Sit to/from Stand Sit to Stand: Supervision         General transfer comment: Supervision for safety. Stood from [employer] x1. No dizziness.  Ambulation/Gait Ambulation/Gait assistance: Supervision Gait Distance (Feet): 30 Feet Assistive device: None Gait Pattern/deviations:  Step-through pattern;Decreased stride length;Staggering right Gait velocity: decreased   General Gait Details: Slow, mostly steady gait with 1 stumble into counter; no dizziness.   Stairs            Wheelchair Mobility    Modified Rankin (Stroke Patients Only)       Balance Overall balance assessment: Mild deficits observed, not formally tested                                           Pertinent Vitals/Pain Pain Assessment: No/denies pain    Home Living Family/patient expects to be discharged to:: Private residence Living Arrangements: Non-relatives/Friends     Home Access: Stairs to enter Entrance Stairs-Rails: Right Entrance Stairs-Number of Steps: 5 Home Layout: One level Home Equipment: Cane - single point      Prior Function Level of Independence: Independent         Comments: Drives. Cooks, cleans. does grocery shopping. Larey SeatFell 3 times in the last 6 months. Just finished doing HHPT last week     Hand Dominance        Extremity/Trunk Assessment   Upper Extremity Assessment Upper Extremity Assessment: Defer to OT evaluation    Lower Extremity Assessment Lower Extremity Assessment: Overall WFL for tasks assessed       Communication   Communication: No difficulties  Cognition Arousal/Alertness: Awake/alert Behavior During Therapy: WFL for tasks assessed/performed Overall Cognitive Status: Within Functional Limits for tasks assessed  General Comments General comments (skin integrity, edema, etc.): Roommate present in room. HR up to 115 bpm. Sitting BP 129/60, standing BP with marching 94/69, asymptomatic.    Exercises     Assessment/Plan    PT Assessment Patient needs continued PT services  PT Problem List Decreased mobility;Decreased safety awareness;Decreased balance;Cardiopulmonary status limiting activity       PT Treatment Interventions Functional mobility  training;Balance training;Patient/family education;Therapeutic activities;Therapeutic exercise;Stair training;Gait training    PT Goals (Current goals can be found in the Care Plan section)  Acute Rehab PT Goals Patient Stated Goal: to go home PT Goal Formulation: With patient Time For Goal Achievement: 12/15/18 Potential to Achieve Goals: Good    Frequency Min 3X/week   Barriers to discharge        Co-evaluation               AM-PAC PT "6 Clicks" Mobility  Outcome Measure Help needed turning from your back to your side while in a flat bed without using bedrails?: None Help needed moving from lying on your back to sitting on the side of a flat bed without using bedrails?: None Help needed moving to and from a bed to a chair (including a wheelchair)?: None Help needed standing up from a chair using your arms (e.g., wheelchair or bedside chair)?: None Help needed to walk in hospital room?: A Little Help needed climbing 3-5 steps with a railing? : A Little 6 Click Score: 22    End of Session Equipment Utilized During Treatment: Gait belt Activity Tolerance: Treatment limited secondary to medical complications (Comment)(drop in SBP) Patient left: in bed;with call bell/phone within reach;with bed alarm set;with family/visitor present Nurse Communication: Mobility status PT Visit Diagnosis: Unsteadiness on feet (R26.81);Difficulty in walking, not elsewhere classified (R26.2)    Time: 1515-1530 PT Time Calculation (min) (ACUTE ONLY): 15 min   Charges:   PT Evaluation $PT Eval Low Complexity: 1 Low          Mylo RedShauna Jari Carollo, PT, DPT Acute Rehabilitation Services Pager [phone] Office [phone]      Blake DivineShauna A Lanier EnsignHartshorne 12/01/2018, 3:37 PM

## 2018-12-02 DIAGNOSIS — N171 Acute kidney failure with acute cortical necrosis: Secondary | ICD-10-CM

## 2018-12-02 LAB — MAGNESIUM: Magnesium: 1.6 mg/dL — ABNORMAL LOW (ref 1.7–2.4)

## 2018-12-02 LAB — CBC
HCT: 28.3 % — ABNORMAL LOW (ref 36.0–46.0)
Hemoglobin: 8.7 g/dL — ABNORMAL LOW (ref 12.0–15.0)
MCH: 28.3 pg (ref 26.0–34.0)
MCHC: 30.7 g/dL (ref 30.0–36.0)
MCV: 92.2 fL (ref 80.0–100.0)
Platelets: 176 10*3/uL (ref 150–400)
RBC: 3.07 MIL/uL — ABNORMAL LOW (ref 3.87–5.11)
RDW: 13.6 % (ref 11.5–15.5)
WBC: 6 10*3/uL (ref 4.0–10.5)
nRBC: 0 % (ref 0.0–0.2)

## 2018-12-02 LAB — BASIC METABOLIC PANEL
Anion gap: 7 (ref 5–15)
BUN: 32 mg/dL — ABNORMAL HIGH (ref 8–23)
CO2: 22 mmol/L (ref 22–32)
CREATININE: 2.57 mg/dL — AB (ref 0.44–1.00)
Calcium: 8.1 mg/dL — ABNORMAL LOW (ref 8.9–10.3)
Chloride: 109 mmol/L (ref 98–111)
GFR calc Af Amer: 20 mL/min — ABNORMAL LOW (ref >60)
GFR calc non Af Amer: 17 mL/min — ABNORMAL LOW (ref >60)
Glucose, Bld: 79 mg/dL (ref 70–99)
Potassium: 4.6 mmol/L (ref 3.5–5.1)
Sodium: 138 mmol/L (ref 135–145)

## 2018-12-02 MED ORDER — MAGNESIUM SULFATE 2 GM/50ML IV SOLN
2.0000 g | Freq: Once | INTRAVENOUS | Status: AC
  Administered 2018-12-02: 2 g via INTRAVENOUS
  Filled 2018-12-02: qty 50

## 2018-12-02 MED ORDER — LACTATED RINGERS IV SOLN: INTRAVENOUS | Status: DC

## 2018-12-02 MED ORDER — FUROSEMIDE 40 MG PO TABS: 20.0000 mg | ORAL_TABLET | Freq: Every day | ORAL | 0 refills | Status: DC

## 2018-12-02 NOTE — Discharge Summary (Signed)
Kathleen Nielsen GQB:[id number] DOB: May 04, 1941 DOA: 12/01/2018  PCP: Patient, No Pcp Per  Admit date: 12/01/2018  Discharge date: 12/02/2018  Admitted From: Home  Disposition:  Home   Recommendations for Outpatient Follow-up:   Follow up with PCP in 1-2 weeks  PCP Please obtain BMP/CBC, 2 view CXR in 1week,  (see Discharge instructions)   PCP Please follow up on the following pending results:    Home Health: None   Equipment/Devices: None  Consultations: Noen Discharge Condition: Stable   CODE STATUS: Ful   Diet Recommendation: Heart Healthy   Chief Complaint  Patient presents with  . Loss of Consciousness     Brief history of present illness from the day of admission and additional interim summary      Kathleen Nielsen  is a 78 y.o. female, 3 of hypertension on diuretic, ACE inhibitor and Coreg, dyslipidemia who was in usual state of health until last night when her daughter-in-law tried to wake her up and she thought she was more drowsy than usual, she was brought to the ER where she was found to be dehydrated and acute renal failure along with hyperkalemia, we were requested to admit her for further care.  Patient currently is strictly symptom-free denies any headache chest pain cough shortness of breath, no diarrhea or dysuria or focal weakness.  Completely symptom-free.                                                                   Hospital Course   1.  Dehydration induced acute renal failure and hyperkalemia.  Caused by underlying use of diuretics and ACE inhibitor along with potassium supplementation, all offending medications were discontinued, she was hydrated and given Kayexalate, now potassium level is stable, creatinine has come down from 3.5-2.5, good urine output, will discharge her on Coreg and low-dose  Lasix which she will start taking in 3 days from now, requested her to follow with PCP in 3 days.  PCP to recheck CBC BMP next visit.  Baseline renal function unknown we are presuming this is ARF.  Renal ultrasound was stable.  2.  Hypertension.  Blood pressure stable.    Low-dose Coreg from today, low-dose Lasix instead of Bumex in 3 days from now.  3.  Stable dyslipidemia.  Continue home dose statin.  4. ? history of CHF in the chart.  No echo on file.  Currently dehydrated.  Outpatient follow-up with PCP and primary cardiologist.     Discharge diagnosis     Principal Problem:   ARF (acute renal failure) (HCC) Active Problems:   Syncope   Hyperkalemia   Essential hypertension    Discharge instructions    Discharge Instructions    Diet - low sodium heart healthy   Complete by:  As directed  Discharge instructions   Complete by:  As directed    Take a copy of my discharge summary and show it to your primary care physician in 3 days.    Follow with Primary MD in 3 days   Get CBC, CMP, 2 view Chest X ray -  checked  by Primary MD  in 3 days   Activity: As tolerated with Full fall precautions use walker/cane & assistance as needed  Disposition Home    Diet: Heart Healthy   Special Instructions: If you have smoked or chewed Tobacco  in the last 2 yrs please stop smoking, stop any regular Alcohol  and or any Recreational drug use.  On your next visit with your primary care physician please Get Medicines reviewed and adjusted.  Please request your Prim.MD to go over all Hospital Tests and Procedure/Radiological results at the follow up, please get all Hospital records sent to your Prim MD by signing hospital release before you go home.  If you experience worsening of your admission symptoms, develop shortness of breath, life threatening emergency, suicidal or homicidal thoughts you must seek medical attention immediately by calling 911 or calling your MD immediately  if  symptoms less severe.  You Must read complete instructions/literature along with all the possible adverse reactions/side effects for all the Medicines you take and that have been prescribed to you. Take any new Medicines after you have completely understood and accpet all the possible adverse reactions/side effects.   Increase activity slowly   Complete by:  As directed       Discharge Medications   Allergies as of 12/02/2018   No Known Allergies     Medication List    STOP taking these medications   benazepril 20 MG tablet Commonly known as:  LOTENSIN   potassium chloride 10 MEQ tablet Commonly known as:  K-DUR,KLOR-CON   torsemide 5 MG tablet Commonly known as:  DEMADEX     TAKE these medications   aspirin 81 MG chewable tablet Chew 81 mg by mouth daily.   atorvastatin 20 MG tablet Commonly known as:  LIPITOR Take 20 mg by mouth daily at 6 PM.   carvedilol 3.125 MG tablet Commonly known as:  COREG Take 3.125 mg by mouth 2 (two) times daily.   furosemide 40 MG tablet Commonly known as:  LASIX Take 0.5 tablets (20 mg total) by mouth daily. Start taking on:  December 05, 2018   gabapentin 300 MG capsule Commonly known as:  NEURONTIN Take 300 mg by mouth 2 (two) times daily.       Follow-up Information    Your PCP. Schedule an appointment as soon as possible for a visit in 3 day(s).   Contact information: get CBC, BMP checked          Major procedures and Radiology Reports - PLEASE review detailed and final reports thoroughly  -        Dg Chest 2 View  Result Date: 12/01/2018 CLINICAL DATA:  Syncope, hypotension EXAM: CHEST - 2 VIEW COMPARISON:  11/02/2018, 10/27/2018 FINDINGS: Cardiomegaly. Interstitial prominence throughout the lungs. This could reflect chronic interstitial lung disease or interstitial edema. No visible significant effusions or acute bony abnormality. IMPRESSION: Cardiomegaly. Diffuse interstitial prominence throughout the lungs,  stable dating back to 10/27/2018. This could reflect chronic interstitial lung disease/fibrosis or interstitial edema. Electronically Signed   By: Charlett NoseKevin  Dover M.D.   On: 12/01/2018 02:01   [place] Renal  Result Date: 12/01/2018 CLINICAL DATA:  Acute renal  failure EXAM: RENAL / URINARY TRACT ULTRASOUND COMPLETE COMPARISON:  Renal ultrasound 10/29/2018 FINDINGS: Right Kidney: Renal measurements: 9.0 x 4.6 x 4.6 cm = volume: 99.4 mL . Echogenicity within normal limits. No mass or hydronephrosis visualized. Left Kidney: Renal measurements: 9.1 x 4.3 x 4.1 cm = volume: 83.7 mL. Echogenicity within normal limits. No mass or hydronephrosis visualized. Bladder: Appears normal for degree of bladder distention. Indeterminate hypoechoic lesion within the right lobe of liver measuring 1.2 x 0.6 x 1.5 cm, potentially representing a small cyst. IMPRESSION: No hydronephrosis. Electronically Signed   By: Annia Beltrew  Davis M.D.   On: 12/01/2018 13:08    Micro Results     No results found for this or any previous visit (from the past 240 hour(s)).  Today   Subjective    Kathleen Nielsen today has no headache,no chest abdominal pain,no new weakness tingling or numbness, feels much better wants to go home today.    Objective   Blood pressure 104/61, pulse 87, temperature 98.2 F (36.8 C), temperature source Oral, resp. rate 18, height 5' (1.524 m), weight 78.9 kg, SpO2 97 %.   Intake/Output Summary (Last 24 hours) at 12/02/2018 0855 Last data filed at 12/02/2018 0700 Gross per 24 hour  Intake 1778.77 ml  Output 1200 ml  Net 578.77 ml    Exam  Awake Alert, Oriented x 3, No new F.N deficits, Normal affect [place].AT,PERRAL Supple Neck,No JVD, No cervical lymphadenopathy appriciated.  Symmetrical Chest wall movement, Good air movement bilaterally, CTAB RRR,No Gallops,Rubs or new Murmurs, No Parasternal Heave +ve B.Sounds, Abd Soft, Non tender, No organomegaly appriciated, No rebound -guarding or rigidity. No Cyanosis,  Clubbing or edema, No new Rash or bruise   Data Review   CBC w Diff:  Lab Results  Component Value Date   WBC 6.0 12/02/2018   HGB 8.7 (L) 12/02/2018   HCT 28.3 (L) 12/02/2018   PLT 176 12/02/2018   LYMPHOPCT 15 12/01/2018   MONOPCT 11 12/01/2018   EOSPCT 6 12/01/2018   BASOPCT 0 12/01/2018    CMP:  Lab Results  Component Value Date   NA 138 12/02/2018   K 4.6 12/02/2018   CL 109 12/02/2018   CO2 22 12/02/2018   BUN 32 (H) 12/02/2018   CREATININE 2.57 (H) 12/02/2018   PROT 6.5 12/01/2018   ALBUMIN 3.1 (L) 12/01/2018   BILITOT 1.6 (H) 12/01/2018   ALKPHOS 103 12/01/2018   AST 46 (H) 12/01/2018   ALT 18 12/01/2018  .   Total Time in preparing paper work, data evaluation and todays exam - 35 minutes  Susa RaringPrashant Singh M.D on 12/02/2018 at 8:55 AM  [facility]   Office  [phone]

## 2018-12-02 NOTE — Progress Notes (Signed)
Patient discharge home and refused to wait for Magnesium replacement, Patient discharge home in a wheelchair, patient received prescription and discharge paperwork.

## 2018-12-02 NOTE — Progress Notes (Signed)
Prescription for Magnesium called in at [facility] in [place]

## 2018-12-02 NOTE — Discharge Instructions (Signed)
Take a copy of my discharge summary and show it to your primary care physician in 3 days.    Follow with Primary MD in 3 days   Get CBC, CMP, 2 view Chest X ray -  checked  by Primary MD  in 3 days   Activity: As tolerated with Full fall precautions use walker/cane & assistance as needed  Disposition Home    Diet: Heart Healthy   Special Instructions: If you have smoked or chewed Tobacco  in the last 2 yrs please stop smoking, stop any regular Alcohol  and or any Recreational drug use.  On your next visit with your primary care physician please Get Medicines reviewed and adjusted.  Please request your Prim.MD to go over all Hospital Tests and Procedure/Radiological results at the follow up, please get all Hospital records sent to your Prim MD by signing hospital release before you go home.  If you experience worsening of your admission symptoms, develop shortness of breath, life threatening emergency, suicidal or homicidal thoughts you must seek medical attention immediately by calling 911 or calling your MD immediately  if symptoms less severe.  You Must read complete instructions/literature along with all the possible adverse reactions/side effects for all the Medicines you take and that have been prescribed to you. Take any new Medicines after you have completely understood and accpet all the possible adverse reactions/side effects.

## 2018-12-02 NOTE — Progress Notes (Signed)
Patient in observation, all current HH prior to admission will continue as previously scheduled. No recs for additional HH or DME. No other CM needs identified.

## 2018-12-02 NOTE — Care Management Obs Status (Signed)
MEDICARE OBSERVATION STATUS NOTIFICATION   Patient Details  Name: Kathleen Nielsen MRN: [id number] Date of Birth: October 11, 1941   Medicare Observation Status Notification Given:  Yes    Lawerance Sabal, RN 12/02/2018, 10:26 AM

## 2018-12-05 DIAGNOSIS — N19 Unspecified kidney failure: Secondary | ICD-10-CM | POA: Diagnosis not present

## 2018-12-05 DIAGNOSIS — N178 Other acute kidney failure: Secondary | ICD-10-CM | POA: Diagnosis not present

## 2019-01-10 DIAGNOSIS — G6289 Other specified polyneuropathies: Secondary | ICD-10-CM | POA: Diagnosis not present

## 2019-01-10 DIAGNOSIS — I5032 Chronic diastolic (congestive) heart failure: Secondary | ICD-10-CM | POA: Diagnosis not present

## 2019-01-10 DIAGNOSIS — I10 Essential (primary) hypertension: Secondary | ICD-10-CM | POA: Diagnosis not present

## 2019-03-13 DIAGNOSIS — J40 Bronchitis, not specified as acute or chronic: Secondary | ICD-10-CM | POA: Diagnosis not present

## 2019-05-27 ENCOUNTER — Other Ambulatory Visit: Payer: Self-pay

## 2019-06-13 DIAGNOSIS — I5032 Chronic diastolic (congestive) heart failure: Secondary | ICD-10-CM | POA: Diagnosis not present

## 2019-06-13 DIAGNOSIS — G629 Polyneuropathy, unspecified: Secondary | ICD-10-CM | POA: Diagnosis not present

## 2019-06-13 DIAGNOSIS — I10 Essential (primary) hypertension: Secondary | ICD-10-CM | POA: Diagnosis not present

## 2019-06-13 DIAGNOSIS — N183 Chronic kidney disease, stage 3 (moderate): Secondary | ICD-10-CM | POA: Diagnosis not present

## 2019-09-12 DIAGNOSIS — I5032 Chronic diastolic (congestive) heart failure: Secondary | ICD-10-CM | POA: Diagnosis not present

## 2019-09-12 DIAGNOSIS — Z0000 Encounter for general adult medical examination without abnormal findings: Secondary | ICD-10-CM | POA: Diagnosis not present

## 2019-09-12 DIAGNOSIS — G629 Polyneuropathy, unspecified: Secondary | ICD-10-CM | POA: Diagnosis not present

## 2019-09-12 DIAGNOSIS — N1831 Chronic kidney disease, stage 3a: Secondary | ICD-10-CM | POA: Diagnosis not present

## 2019-09-12 DIAGNOSIS — Z1389 Encounter for screening for other disorder: Secondary | ICD-10-CM | POA: Diagnosis not present

## 2019-09-12 DIAGNOSIS — I10 Essential (primary) hypertension: Secondary | ICD-10-CM | POA: Diagnosis not present

## 2019-12-17 DIAGNOSIS — Z0000 Encounter for general adult medical examination without abnormal findings: Secondary | ICD-10-CM | POA: Diagnosis not present

## 2019-12-17 DIAGNOSIS — G629 Polyneuropathy, unspecified: Secondary | ICD-10-CM | POA: Diagnosis not present

## 2019-12-17 DIAGNOSIS — N183 Chronic kidney disease, stage 3 unspecified: Secondary | ICD-10-CM | POA: Diagnosis not present

## 2019-12-17 DIAGNOSIS — I10 Essential (primary) hypertension: Secondary | ICD-10-CM | POA: Diagnosis not present

## 2019-12-17 DIAGNOSIS — I5032 Chronic diastolic (congestive) heart failure: Secondary | ICD-10-CM | POA: Diagnosis not present

## 2019-12-17 DIAGNOSIS — N1831 Chronic kidney disease, stage 3a: Secondary | ICD-10-CM | POA: Diagnosis not present

## 2020-01-29 DIAGNOSIS — Z23 Encounter for immunization: Secondary | ICD-10-CM | POA: Diagnosis not present

## 2020-02-25 DIAGNOSIS — Z23 Encounter for immunization: Secondary | ICD-10-CM | POA: Diagnosis not present

## 2020-03-10 DIAGNOSIS — I5032 Chronic diastolic (congestive) heart failure: Secondary | ICD-10-CM | POA: Diagnosis not present

## 2020-03-10 DIAGNOSIS — I10 Essential (primary) hypertension: Secondary | ICD-10-CM | POA: Diagnosis not present

## 2020-03-10 DIAGNOSIS — N1831 Chronic kidney disease, stage 3a: Secondary | ICD-10-CM | POA: Diagnosis not present

## 2020-03-10 DIAGNOSIS — G629 Polyneuropathy, unspecified: Secondary | ICD-10-CM | POA: Diagnosis not present

## 2020-06-16 DIAGNOSIS — I10 Essential (primary) hypertension: Secondary | ICD-10-CM | POA: Diagnosis not present

## 2020-06-16 DIAGNOSIS — I5032 Chronic diastolic (congestive) heart failure: Secondary | ICD-10-CM | POA: Diagnosis not present

## 2020-06-16 DIAGNOSIS — N1831 Chronic kidney disease, stage 3a: Secondary | ICD-10-CM | POA: Diagnosis not present

## 2020-06-16 DIAGNOSIS — G629 Polyneuropathy, unspecified: Secondary | ICD-10-CM | POA: Diagnosis not present

## 2020-06-22 DIAGNOSIS — I10 Essential (primary) hypertension: Secondary | ICD-10-CM | POA: Diagnosis not present

## 2020-06-22 DIAGNOSIS — G629 Polyneuropathy, unspecified: Secondary | ICD-10-CM | POA: Diagnosis not present

## 2020-06-22 DIAGNOSIS — I5032 Chronic diastolic (congestive) heart failure: Secondary | ICD-10-CM | POA: Diagnosis not present

## 2020-06-22 DIAGNOSIS — N1831 Chronic kidney disease, stage 3a: Secondary | ICD-10-CM | POA: Diagnosis not present

## 2020-08-05 DIAGNOSIS — Z23 Encounter for immunization: Secondary | ICD-10-CM | POA: Diagnosis not present

## 2020-09-03 DIAGNOSIS — Z23 Encounter for immunization: Secondary | ICD-10-CM | POA: Diagnosis not present

## 2020-09-14 DIAGNOSIS — G629 Polyneuropathy, unspecified: Secondary | ICD-10-CM | POA: Diagnosis not present

## 2020-09-14 DIAGNOSIS — I5032 Chronic diastolic (congestive) heart failure: Secondary | ICD-10-CM | POA: Diagnosis not present

## 2020-09-14 DIAGNOSIS — I10 Essential (primary) hypertension: Secondary | ICD-10-CM | POA: Diagnosis not present

## 2020-09-14 DIAGNOSIS — N1831 Chronic kidney disease, stage 3a: Secondary | ICD-10-CM | POA: Diagnosis not present

## 2020-12-15 DIAGNOSIS — I10 Essential (primary) hypertension: Secondary | ICD-10-CM | POA: Diagnosis not present

## 2020-12-15 DIAGNOSIS — N1831 Chronic kidney disease, stage 3a: Secondary | ICD-10-CM | POA: Diagnosis not present

## 2020-12-15 DIAGNOSIS — G629 Polyneuropathy, unspecified: Secondary | ICD-10-CM | POA: Diagnosis not present

## 2020-12-15 DIAGNOSIS — I5032 Chronic diastolic (congestive) heart failure: Secondary | ICD-10-CM | POA: Diagnosis not present

## 2020-12-15 IMAGING — DX DG CHEST 2V
2 series · 2 of 2 positions shown · non-contrast
Comparison: 11/02/2018, 10/27/2018

CLINICAL DATA: Syncope, hypotension

EXAM:
CHEST - 2 VIEW

[chest pa]
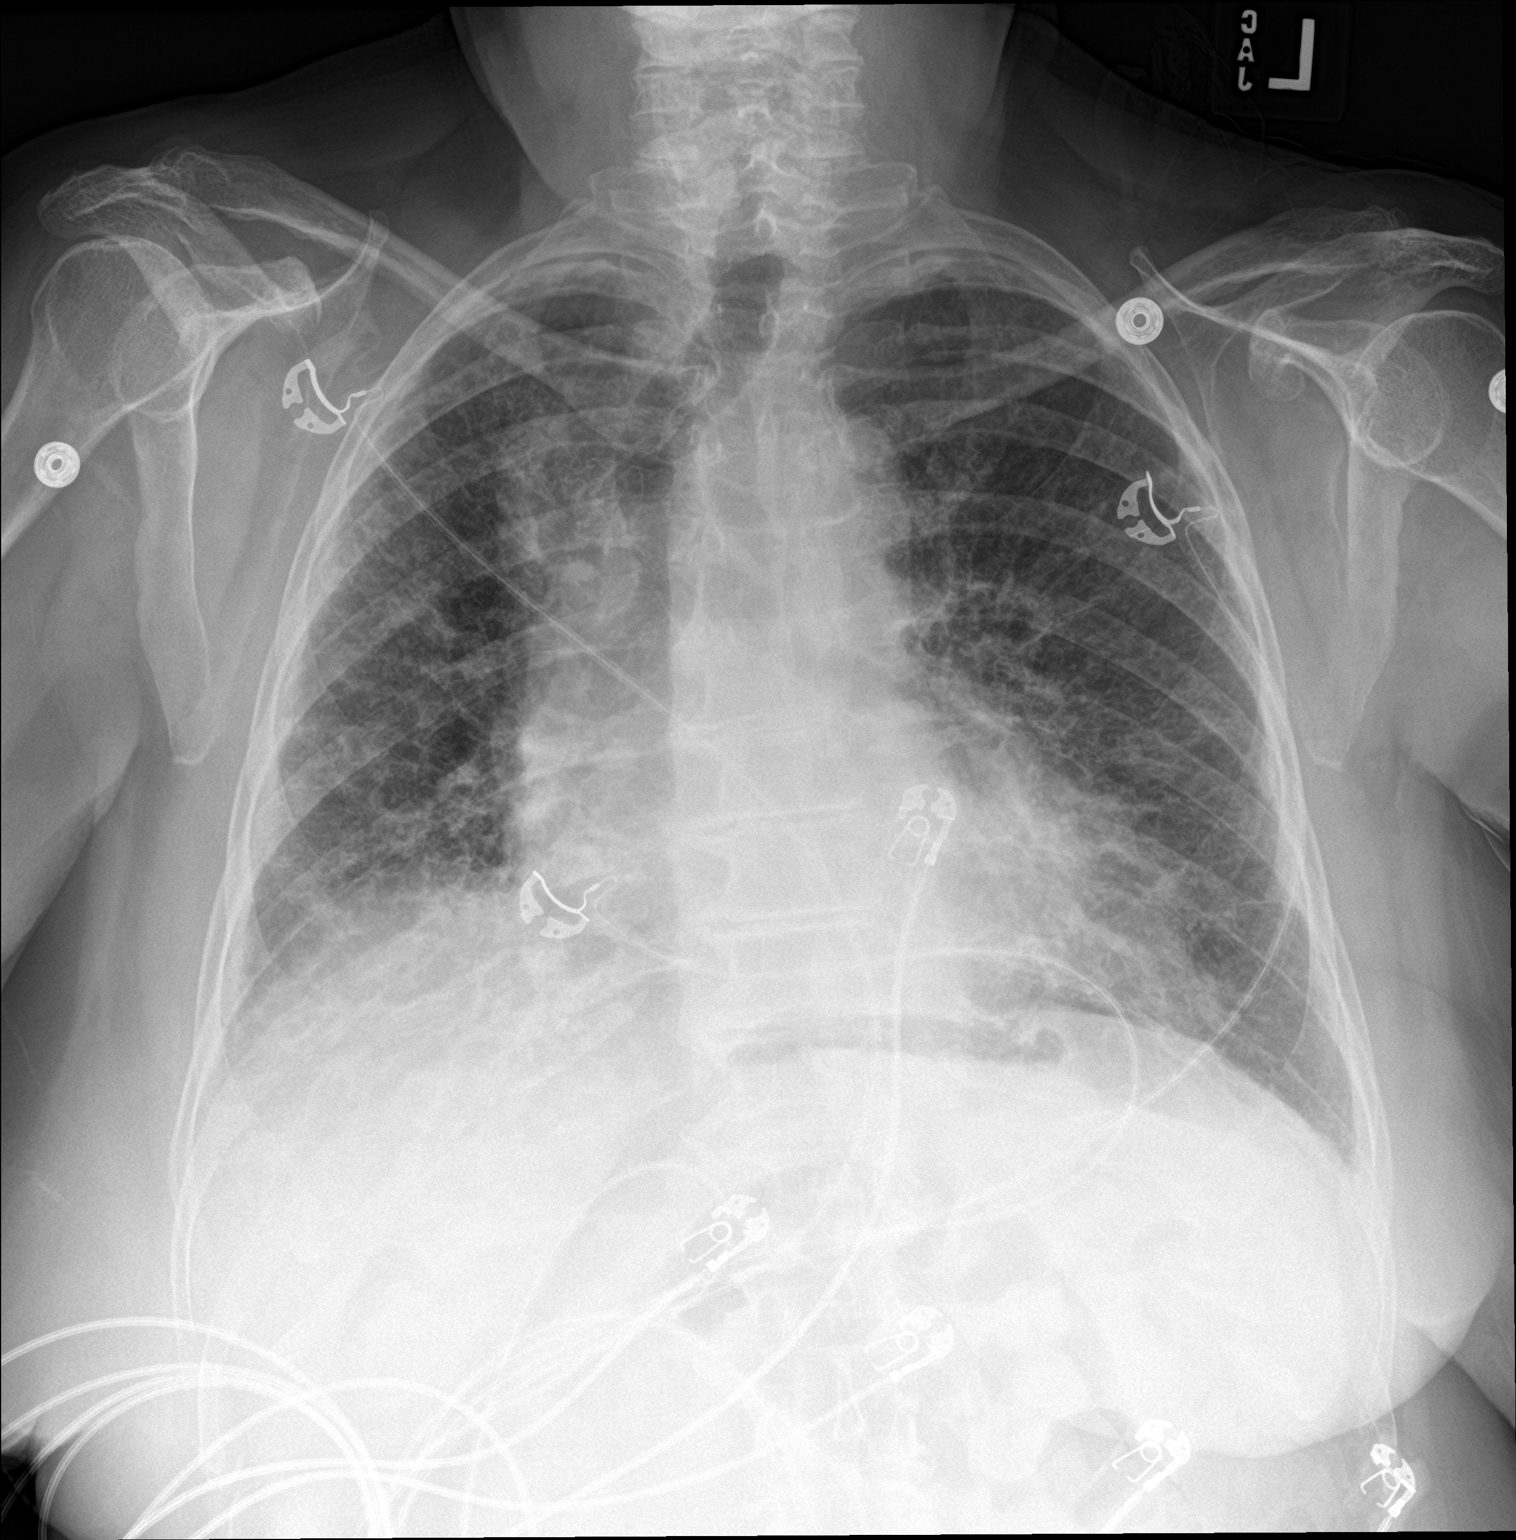

[chest lat]
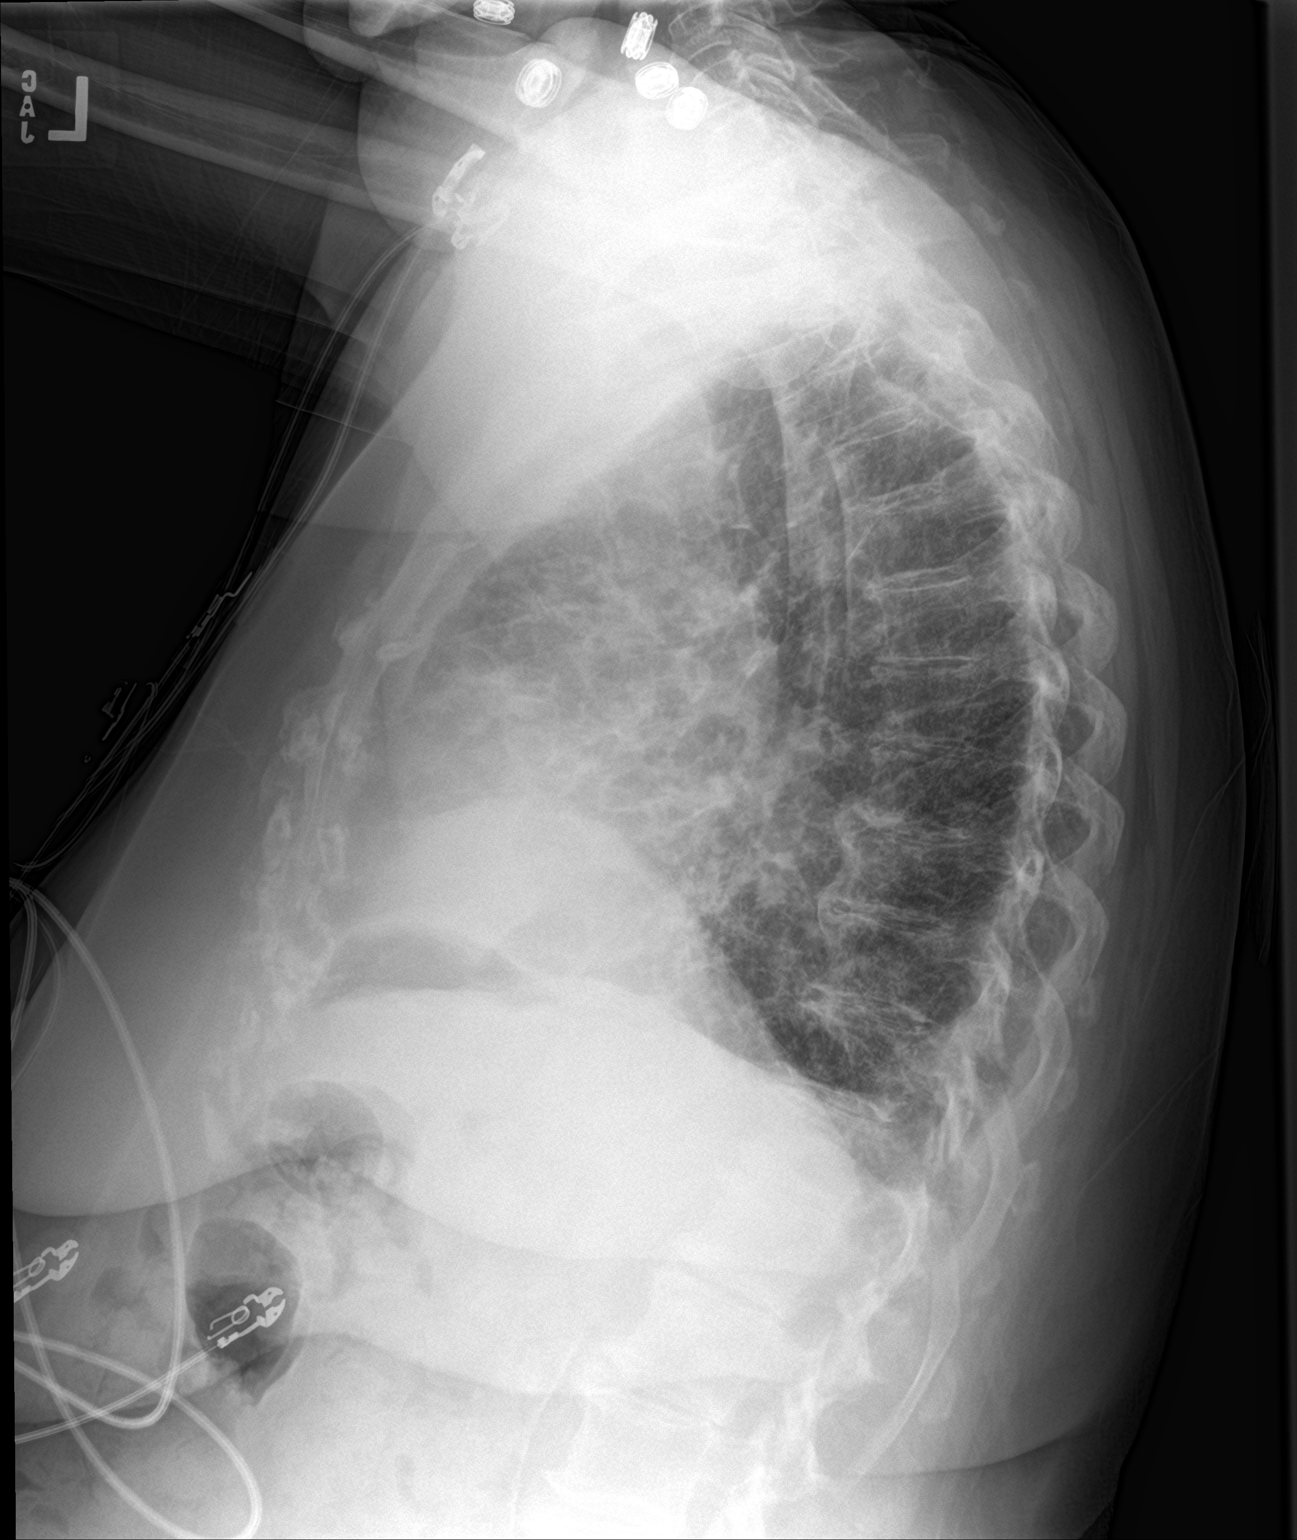

[2 of 2 positions shown; findings below may reference images not displayed]

FINDINGS: Cardiomegaly. Interstitial prominence throughout the lungs. This
could reflect chronic interstitial lung disease or interstitial
edema. No visible significant effusions or acute bony abnormality.
IMPRESSION: Cardiomegaly. Diffuse interstitial prominence throughout the lungs,
stable dating back to 10/27/2018. This could reflect chronic
interstitial lung disease/fibrosis or interstitial edema.

## 2020-12-15 IMAGING — US US RENAL
1 series · 14 of 25 positions shown · non-contrast
Comparison: Renal ultrasound 10/29/2018

CLINICAL DATA: Acute renal failure

EXAM:
RENAL / URINARY TRACT ULTRASOUND COMPLETE

[Series 1: us renal · 14 of 36 slices shown]
[im 1/36]
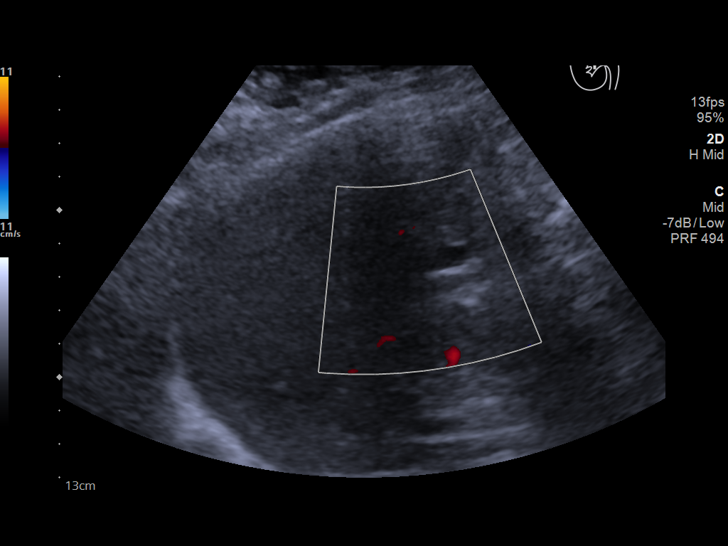
[im 3/36]
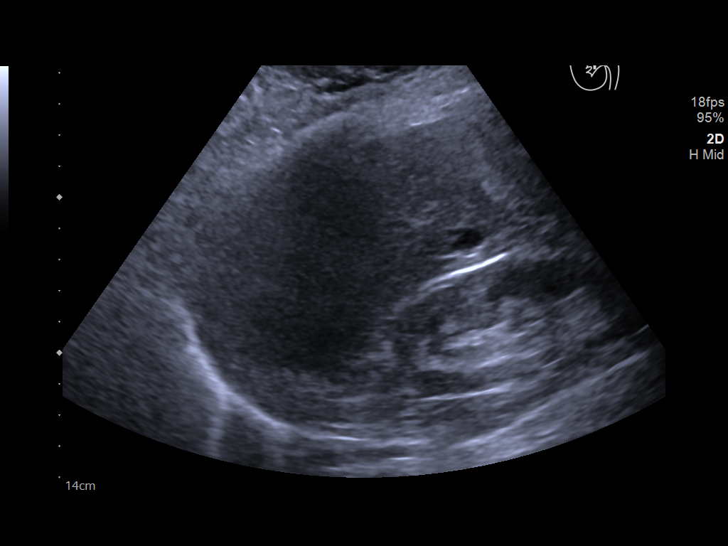
[im 6/36]
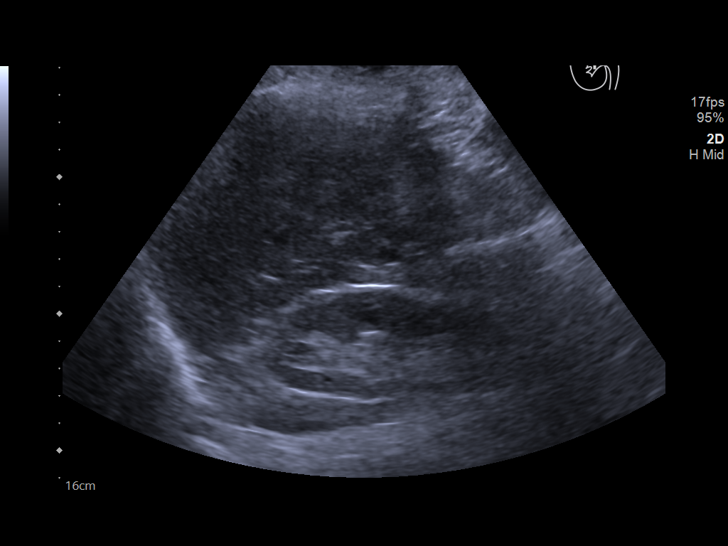
[im 9/36]
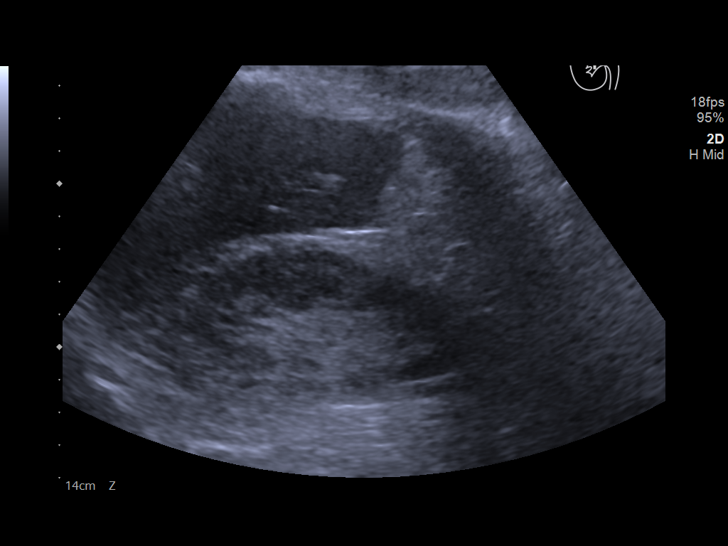
[im 12/36]
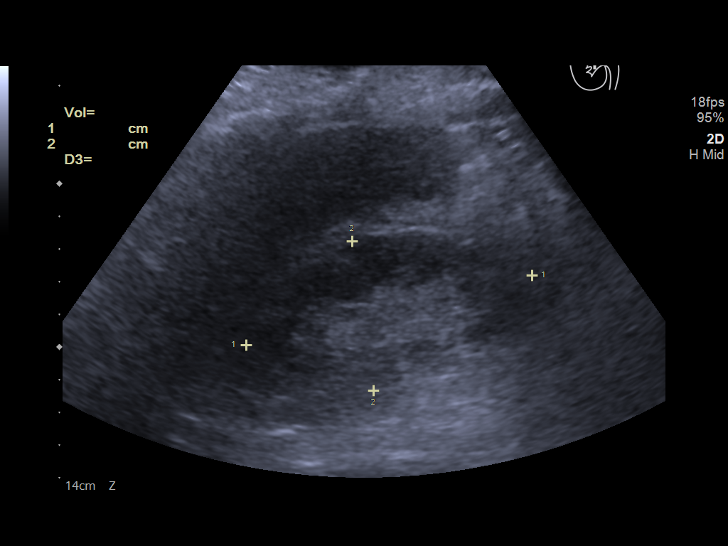
[im 14/36]
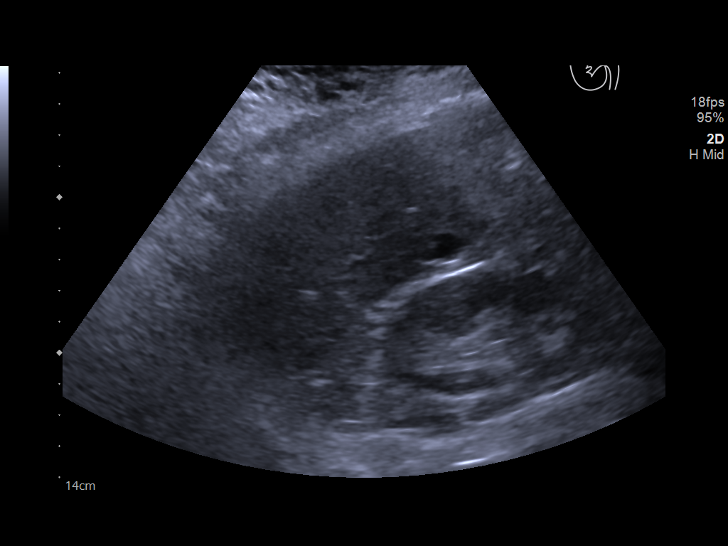
[im 17/36]
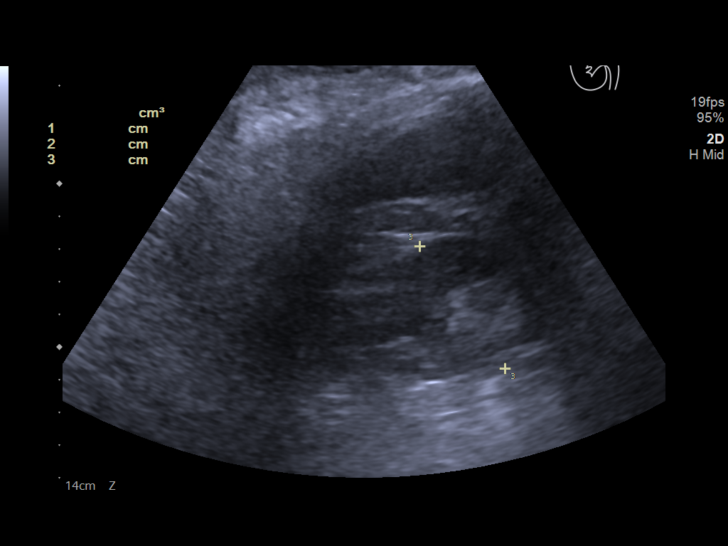
[im 19/36]
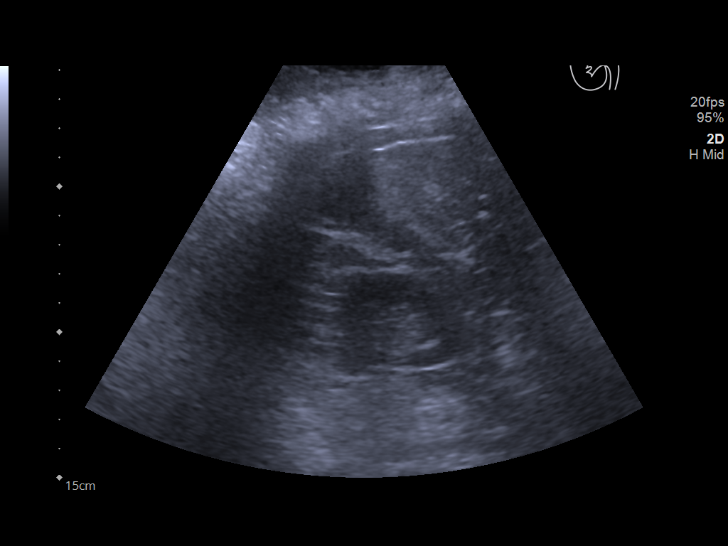
[im 22/36]
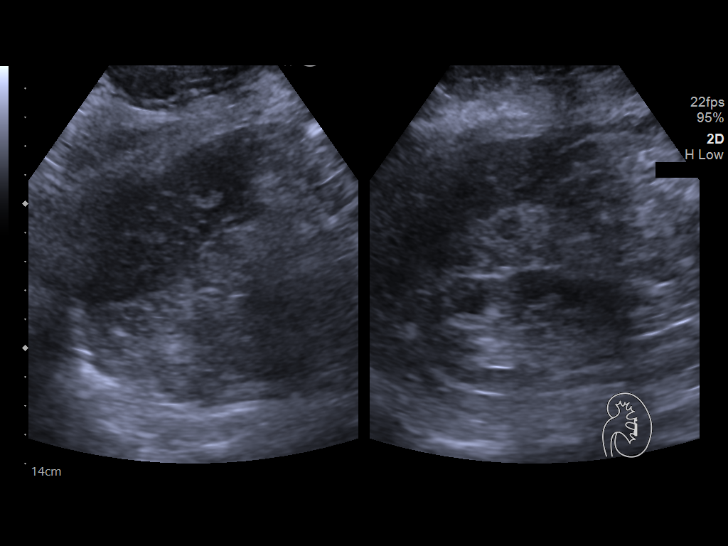
[im 24/36]
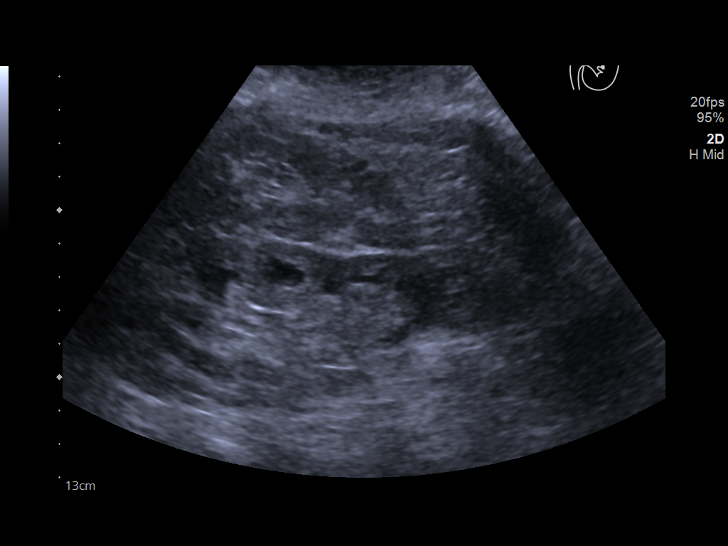
[im 27/36]
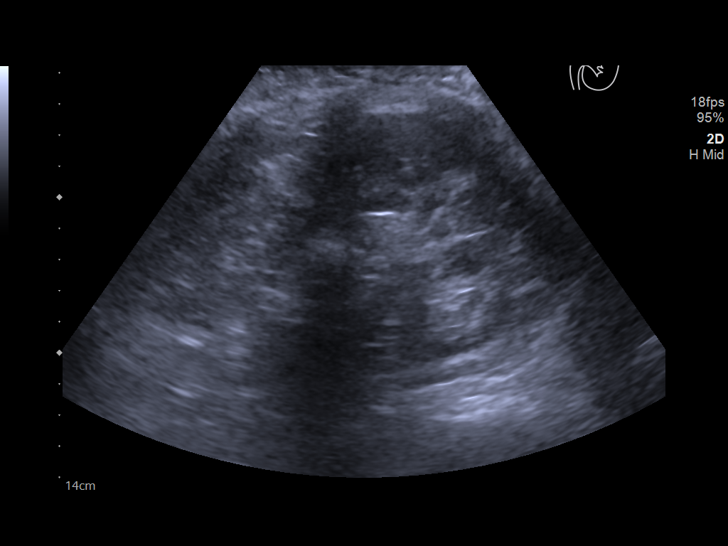
[im 30/36]
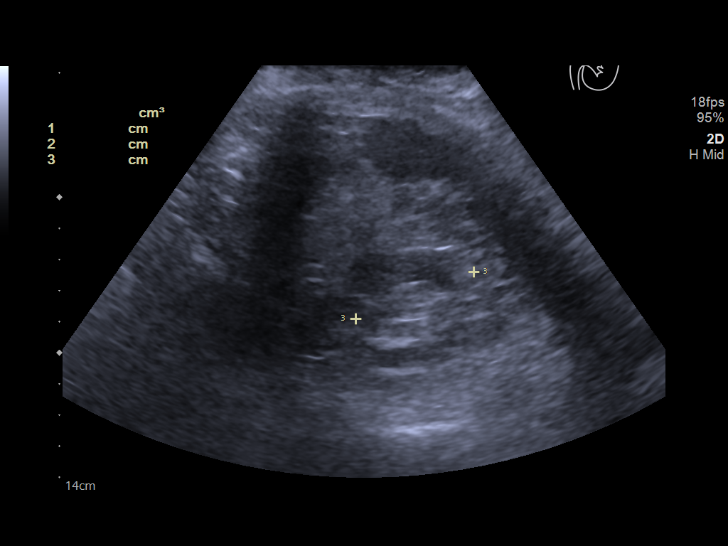
[im 33/36]
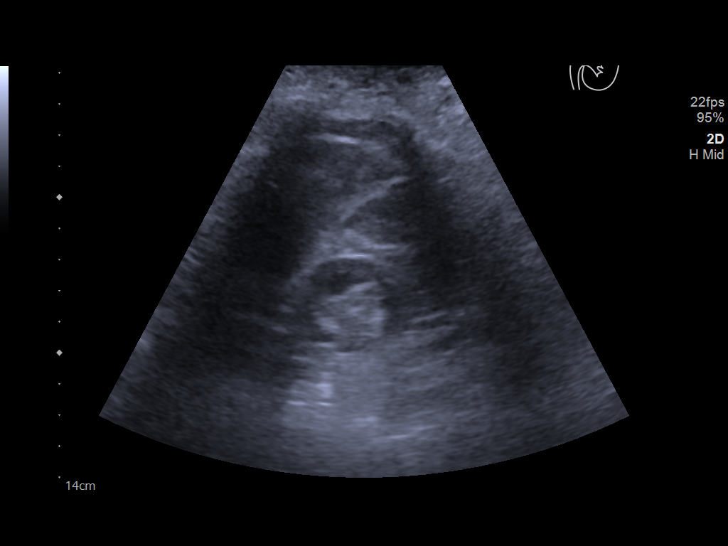
[im 36/36]
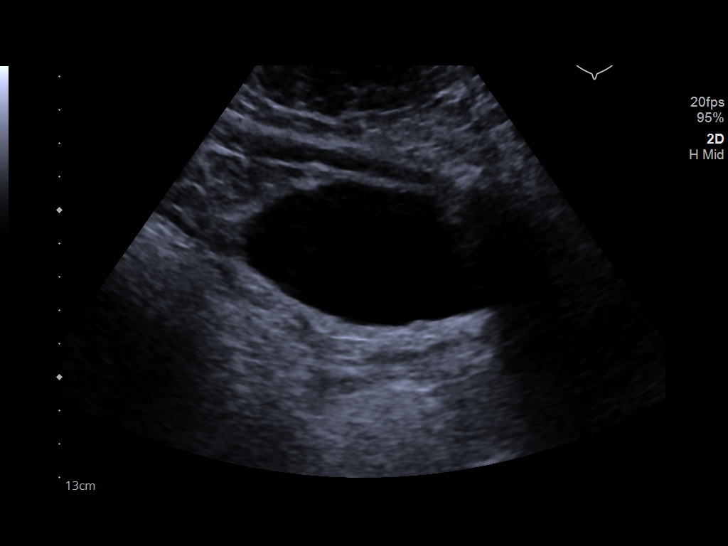

[14 of 25 positions shown; findings below may reference images not displayed]

FINDINGS: Right Kidney:

Renal measurements: 9.0 x 4.6 x 4.6 cm = volume: 99.4 mL .
Echogenicity within normal limits. No mass or hydronephrosis
visualized.

Left Kidney:

Renal measurements: 9.1 x 4.3 x 4.1 cm = volume: 83.7 mL.
Echogenicity within normal limits. No mass or hydronephrosis
visualized.

Bladder:

Appears normal for degree of bladder distention.

Indeterminate hypoechoic lesion within the right lobe of liver
measuring 1.2 x 0.6 x 1.5 cm, potentially representing a small cyst.
IMPRESSION: No hydronephrosis.

## 2020-12-18 DIAGNOSIS — I5032 Chronic diastolic (congestive) heart failure: Secondary | ICD-10-CM | POA: Diagnosis not present

## 2020-12-18 DIAGNOSIS — G629 Polyneuropathy, unspecified: Secondary | ICD-10-CM | POA: Diagnosis not present

## 2020-12-18 DIAGNOSIS — N1831 Chronic kidney disease, stage 3a: Secondary | ICD-10-CM | POA: Diagnosis not present

## 2020-12-18 DIAGNOSIS — I10 Essential (primary) hypertension: Secondary | ICD-10-CM | POA: Diagnosis not present

## 2021-02-25 DIAGNOSIS — Z23 Encounter for immunization: Secondary | ICD-10-CM | POA: Diagnosis not present

## 2021-03-10 DIAGNOSIS — I5032 Chronic diastolic (congestive) heart failure: Secondary | ICD-10-CM | POA: Diagnosis not present

## 2021-03-10 DIAGNOSIS — G629 Polyneuropathy, unspecified: Secondary | ICD-10-CM | POA: Diagnosis not present

## 2021-03-10 DIAGNOSIS — I10 Essential (primary) hypertension: Secondary | ICD-10-CM | POA: Diagnosis not present

## 2021-03-10 DIAGNOSIS — N1831 Chronic kidney disease, stage 3a: Secondary | ICD-10-CM | POA: Diagnosis not present

## 2021-05-13 DIAGNOSIS — H524 Presbyopia: Secondary | ICD-10-CM | POA: Diagnosis not present

## 2021-05-13 DIAGNOSIS — H5231 Anisometropia: Secondary | ICD-10-CM | POA: Diagnosis not present

## 2021-05-13 DIAGNOSIS — H5203 Hypermetropia, bilateral: Secondary | ICD-10-CM | POA: Diagnosis not present

## 2021-05-13 DIAGNOSIS — H2513 Age-related nuclear cataract, bilateral: Secondary | ICD-10-CM | POA: Diagnosis not present

## 2021-05-13 DIAGNOSIS — H53022 Refractive amblyopia, left eye: Secondary | ICD-10-CM | POA: Diagnosis not present

## 2021-06-10 DIAGNOSIS — Z0000 Encounter for general adult medical examination without abnormal findings: Secondary | ICD-10-CM | POA: Diagnosis not present

## 2021-06-10 DIAGNOSIS — G629 Polyneuropathy, unspecified: Secondary | ICD-10-CM | POA: Diagnosis not present

## 2021-06-10 DIAGNOSIS — Z1331 Encounter for screening for depression: Secondary | ICD-10-CM | POA: Diagnosis not present

## 2021-06-10 DIAGNOSIS — I10 Essential (primary) hypertension: Secondary | ICD-10-CM | POA: Diagnosis not present

## 2021-06-10 DIAGNOSIS — I5032 Chronic diastolic (congestive) heart failure: Secondary | ICD-10-CM | POA: Diagnosis not present

## 2021-06-10 DIAGNOSIS — N1831 Chronic kidney disease, stage 3a: Secondary | ICD-10-CM | POA: Diagnosis not present

## 2021-06-11 DIAGNOSIS — Z1331 Encounter for screening for depression: Secondary | ICD-10-CM | POA: Diagnosis not present

## 2021-06-11 DIAGNOSIS — N1831 Chronic kidney disease, stage 3a: Secondary | ICD-10-CM | POA: Diagnosis not present

## 2021-06-11 DIAGNOSIS — Z0000 Encounter for general adult medical examination without abnormal findings: Secondary | ICD-10-CM | POA: Diagnosis not present

## 2021-06-11 DIAGNOSIS — I10 Essential (primary) hypertension: Secondary | ICD-10-CM | POA: Diagnosis not present

## 2021-06-11 DIAGNOSIS — G629 Polyneuropathy, unspecified: Secondary | ICD-10-CM | POA: Diagnosis not present

## 2021-06-11 DIAGNOSIS — I5032 Chronic diastolic (congestive) heart failure: Secondary | ICD-10-CM | POA: Diagnosis not present

## 2021-07-22 DIAGNOSIS — H01001 Unspecified blepharitis right upper eyelid: Secondary | ICD-10-CM | POA: Diagnosis not present

## 2021-07-22 DIAGNOSIS — H35341 Macular cyst, hole, or pseudohole, right eye: Secondary | ICD-10-CM | POA: Diagnosis not present

## 2021-07-22 DIAGNOSIS — H2513 Age-related nuclear cataract, bilateral: Secondary | ICD-10-CM | POA: Diagnosis not present

## 2021-07-22 DIAGNOSIS — H35373 Puckering of macula, bilateral: Secondary | ICD-10-CM | POA: Diagnosis not present

## 2021-07-22 DIAGNOSIS — H01002 Unspecified blepharitis right lower eyelid: Secondary | ICD-10-CM | POA: Diagnosis not present

## 2021-07-22 DIAGNOSIS — H01004 Unspecified blepharitis left upper eyelid: Secondary | ICD-10-CM | POA: Diagnosis not present

## 2021-08-04 DIAGNOSIS — Z23 Encounter for immunization: Secondary | ICD-10-CM | POA: Diagnosis not present

## 2021-08-09 DIAGNOSIS — H2511 Age-related nuclear cataract, right eye: Secondary | ICD-10-CM | POA: Diagnosis not present

## 2021-08-11 ENCOUNTER — Encounter (HOSPITAL_COMMUNITY)
Admission: RE | Admit: 2021-08-11 | Discharge: 2021-08-11 | Disposition: A | Discharge status: Home / Self Care | Payer: Medicare Other | Source: Ambulatory Visit | Attending: Ophthalmology | Admitting: Ophthalmology

## 2021-08-11 ENCOUNTER — Other Ambulatory Visit: Payer: Self-pay

## 2021-08-11 NOTE — H&P (Signed)
Surgical History & Physical  Patient Name: Kathleen Nielsen DOB: 09/11/1941  Surgery: Cataract extraction with intraocular lens implant phacoemulsification; Right Eye  Surgeon: [name] MD Surgery Date:  08-13-21 Pre-Op Date:  07-22-21  HPI: A 77 Yr. old female patient 1. The patient complains of difficulty when viewing TV, reading closed caption, news scrolls on TV, which began many years ago. The left eye is affected. The episode is gradual. The condition's severity increased since last visit. Symptoms occur when the patient is inside and outside. is referred by Dr Daphine Deutscher for cataract eval. This is negatively affecting the patient's quality of life. HPI was performed by [name] .  Medical History: Amblyopia OS  High Blood Pressure LDL Pneumonia (2020) Drug interaction Benazepril/HCTZ  Review of Systems Negative Allergic/Immunologic Negative Cardiovascular Negative Constitutional Negative Ear, Nose, Mouth & Throat Negative Endocrine Negative Eyes Negative Gastrointestinal Negative Genitourinary Negative Hemotologic/Lymphatic Negative Integumentary Negative Musculoskeletal Negative Neurological Negative Psychiatry Negative Respiratory  Social   Never smoked   Medication Atorvastatin, Calcium Supplement, Gabapentin, Furosemide, Carvedilol, Vitamin D3, Vitamin B-12, Folic acid,   Sx/Procedures Benazepril/HCTZ interaction, C Section, Appendectomy,   Drug Allergies   NKDA  History & Physical: Heent: Cataract, Right Eye NECK: supple without bruits LUNGS: lungs clear to auscultation CV: regular rate and rhythm Abdomen: soft and non-tender  Impression & Plan: Assessment: 1.  NUCLEAR SCLEROSIS AGE RELATED; Both Eyes (H25.13) 2.  BLEPHARITIS; Right Upper Lid, Right Lower Lid, Left Upper Lid, Left Lower Lid (H01.001, H01.002,H01.004,H01.005) 3.  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  Cataract  accounts for the patient's decreased vision. This visual impairment is not correctable with a tolerable change in glasses or contact lenses. Cataract surgery with an implantation of a new lens should significantly improve the visual and functional status of the patient. Discussed all risks, benefits, alternatives, and potential complications. Discussed the procedures and recovery. Patient desires to have surgery. A-scan ordered and performed today for intra-ocular lens calculations. The surgery will be performed in order to improve vision for driving, reading, and for eye examinations. Recommend phacoemulsification with intra-ocular lens. Recommend Dextenza for post-operative pain and inflammation. Right Eye worse - first. Dilates poorly - shugacaine by protocol. Omidira.  2.  Recommend regular lid cleaning.  3.  Discussed diagnosis in detail with patient. No treatment is required at this time. Will continue to observe condition and or symptoms.  4.  From ERM. Will limit final visual potential. Monitor.

## 2021-08-13 ENCOUNTER — Ambulatory Visit (HOSPITAL_COMMUNITY): Payer: Medicare Other | Admitting: Anesthesiology

## 2021-08-13 ENCOUNTER — Ambulatory Visit (HOSPITAL_COMMUNITY)
Admission: RE | Admit: 2021-08-13 | Discharge: 2021-08-13 | Disposition: A | Discharge status: Home / Self Care | Payer: Medicare Other | Source: Ambulatory Visit | Attending: Ophthalmology | Admitting: Ophthalmology

## 2021-08-13 ENCOUNTER — Encounter (HOSPITAL_COMMUNITY)
Admission: RE | Disposition: A | Discharge status: Home / Self Care | Payer: Self-pay | Source: Ambulatory Visit | Attending: Ophthalmology

## 2021-08-13 ENCOUNTER — Encounter (HOSPITAL_COMMUNITY): Payer: Self-pay | Admitting: Ophthalmology

## 2021-08-13 DIAGNOSIS — I509 Heart failure, unspecified: Secondary | ICD-10-CM | POA: Diagnosis not present

## 2021-08-13 DIAGNOSIS — H35373 Puckering of macula, bilateral: Secondary | ICD-10-CM | POA: Insufficient documentation

## 2021-08-13 DIAGNOSIS — H0100B Unspecified blepharitis left eye, upper and lower eyelids: Secondary | ICD-10-CM | POA: Insufficient documentation

## 2021-08-13 DIAGNOSIS — H35341 Macular cyst, hole, or pseudohole, right eye: Secondary | ICD-10-CM | POA: Diagnosis not present

## 2021-08-13 DIAGNOSIS — H0100A Unspecified blepharitis right eye, upper and lower eyelids: Secondary | ICD-10-CM | POA: Insufficient documentation

## 2021-08-13 DIAGNOSIS — I110 Hypertensive heart disease with heart failure: Secondary | ICD-10-CM | POA: Diagnosis not present

## 2021-08-13 DIAGNOSIS — H2511 Age-related nuclear cataract, right eye: Secondary | ICD-10-CM | POA: Insufficient documentation

## 2021-08-13 HISTORY — PX: CATARACT EXTRACTION W/PHACO: SHX586

## 2021-08-13 SURGERY — PHACOEMULSIFICATION, CATARACT, WITH IOL INSERTION
Anesthesia: Monitor Anesthesia Care | Laterality: Right

## 2021-08-13 MED ORDER — LIDOCAINE HCL 3.5 % OP GEL
1.0000 "application " | Freq: Once | OPHTHALMIC | Status: AC
  Administered 2021-08-13: 1 via OPHTHALMIC

## 2021-08-13 MED ORDER — STERILE WATER FOR IRRIGATION IR SOLN
Status: DC | PRN
  Administered 2021-08-13: 1000 mL

## 2021-08-13 MED ORDER — TROPICAMIDE 1 % OP SOLN
1.0000 [drp] | OPHTHALMIC | Status: AC | PRN
  Administered 2021-08-13 (×3): 1 [drp] via OPHTHALMIC

## 2021-08-13 MED ORDER — SODIUM HYALURONATE 23MG/ML IO SOSY
PREFILLED_SYRINGE | INTRAOCULAR | Status: DC | PRN
  Administered 2021-08-13: 0.6 mL via INTRAOCULAR

## 2021-08-13 MED ORDER — EPINEPHRINE PF 1 MG/ML IJ SOLN
INTRAMUSCULAR | Status: AC
  Filled 2021-08-13: qty 1

## 2021-08-13 MED ORDER — SODIUM HYALURONATE 10 MG/ML IO SOLUTION
PREFILLED_SYRINGE | INTRAOCULAR | Status: DC | PRN
  Administered 2021-08-13: 0.85 mL via INTRAOCULAR

## 2021-08-13 MED ORDER — EPINEPHRINE PF 1 MG/ML IJ SOLN
INTRAOCULAR | Status: DC | PRN
  Administered 2021-08-13: 500 mL

## 2021-08-13 MED ORDER — PHENYLEPHRINE HCL 2.5 % OP SOLN
1.0000 [drp] | OPHTHALMIC | Status: AC | PRN
  Administered 2021-08-13 (×3): 1 [drp] via OPHTHALMIC

## 2021-08-13 MED ORDER — BSS IO SOLN
INTRAOCULAR | Status: DC | PRN
  Administered 2021-08-13: 15 mL via INTRAOCULAR

## 2021-08-13 MED ORDER — TETRACAINE HCL 0.5 % OP SOLN
1.0000 [drp] | OPHTHALMIC | Status: AC | PRN
  Administered 2021-08-13 (×3): 1 [drp] via OPHTHALMIC

## 2021-08-13 MED ORDER — PHENYLEPHRINE-KETOROLAC 1-0.3 % IO SOLN
INTRAOCULAR | Status: AC
  Filled 2021-08-13: qty 4

## 2021-08-13 MED ORDER — LIDOCAINE HCL (PF) 1 % IJ SOLN
INTRAOCULAR | Status: DC | PRN
  Administered 2021-08-13: 1 mL via OPHTHALMIC

## 2021-08-13 MED ORDER — NEOMYCIN-POLYMYXIN-DEXAMETH 3.5-10000-0.1 OP SUSP
OPHTHALMIC | Status: DC | PRN
  Administered 2021-08-13: 1 [drp] via OPHTHALMIC

## 2021-08-13 MED ORDER — POVIDONE-IODINE 5 % OP SOLN
OPHTHALMIC | Status: DC | PRN
  Administered 2021-08-13: 1 via OPHTHALMIC

## 2021-08-13 SURGICAL SUPPLY — 12 items
CLOTH BEACON ORANGE TIMEOUT ST (SAFETY) ×1 IMPLANT
EYE SHIELD UNIVERSAL CLEAR (GAUZE/BANDAGES/DRESSINGS) ×1 IMPLANT
GLOVE SURG UNDER POLY LF SZ6.5 (GLOVE) ×1 IMPLANT
GLOVE SURG UNDER POLY LF SZ7 (GLOVE) ×1 IMPLANT
NDL HYPO 18GX1.5 BLUNT FILL (NEEDLE) IMPLANT
NEEDLE HYPO 18GX1.5 BLUNT FILL (NEEDLE) ×2 IMPLANT
PAD ARMBOARD 7.5X6 YLW CONV (MISCELLANEOUS) ×1 IMPLANT
RAYONE EMV US (Intraocular Lens) ×1 IMPLANT
SYR TB 1ML LL NO SAFETY (SYRINGE) ×1 IMPLANT
TAPE SURG TRANSPORE 1 IN (GAUZE/BANDAGES/DRESSINGS) IMPLANT
TAPE SURGICAL TRANSPORE 1 IN (GAUZE/BANDAGES/DRESSINGS) ×2
WATER STERILE IRR 250ML POUR (IV SOLUTION) ×1 IMPLANT

## 2021-08-13 NOTE — Transfer of Care (Signed)
Immediate Anesthesia Transfer of Care Note  Patient: Kathleen Nielsen  Procedure(s) Performed: CATARACT EXTRACTION PHACO AND INTRAOCULAR LENS PLACEMENT (IOC) (Right)  Patient Location: Short Stay  Anesthesia Type:MAC  Level of Consciousness: awake  Airway & Oxygen Therapy: Patient Spontanous Breathing  Post-op Assessment: Report given to RN  Post vital signs: Reviewed  Last Vitals:  Vitals Value Taken Time  BP    Temp    Pulse    Resp    SpO2      Last Pain:  Vitals:   08/13/21 0756  TempSrc: Oral  PainSc: 0-No pain      Patients Stated Pain Goal: 5 (93/57/01 [id number])  Complications: No notable events documented.

## 2021-08-13 NOTE — Discharge Instructions (Addendum)
Please discharge patient when stable, will follow up today with Dr. Wrzosek at the [facility] [place] office immediately following discharge.  Leave shield in place until visit.  All paperwork with discharge instructions will be given at the office.  [facility] [place] Address:  [address]  [place], [place] [address]  

## 2021-08-13 NOTE — Interval H&P Note (Signed)
History and Physical Interval Note:  08/13/2021 8:50 AM  Kathleen Nielsen  has presented today for surgery, with the diagnosis of nuclear cataract right eye.  The various methods of treatment have been discussed with the patient and family. After consideration of risks, benefits and other options for treatment, the patient has consented to  Procedure(s) with comments: CATARACT EXTRACTION PHACO AND INTRAOCULAR LENS PLACEMENT (IOC) (Right) - right as a surgical intervention.  The patient's history has been reviewed, patient examined, no change in status, stable for surgery.  I have reviewed the patient's chart and labs.  Questions were answered to the patient's satisfaction.     [name]

## 2021-08-13 NOTE — Anesthesia Postprocedure Evaluation (Signed)
Anesthesia Post Note  Patient: Kathleen Nielsen  Procedure(s) Performed: CATARACT EXTRACTION PHACO AND INTRAOCULAR LENS PLACEMENT (IOC) (Right)  Patient location during evaluation: Short Stay Anesthesia Type: MAC Level of consciousness: awake and alert Pain management: pain level controlled Vital Signs Assessment: post-procedure vital signs reviewed and stable Respiratory status: spontaneous breathing Cardiovascular status: blood pressure returned to baseline and stable Postop Assessment: no apparent nausea or vomiting Anesthetic complications: no   No notable events documented.   Last Vitals:  Vitals:   08/13/21 0756 08/13/21 0803  BP: (!) 185/54 (!) 161/69  Pulse: 83   Resp: 20   Temp: 36.5 C   SpO2: 97%     Last Pain:  Vitals:   08/13/21 0756  TempSrc: Oral  PainSc: 0-No pain                 Dell Hurtubise

## 2021-08-13 NOTE — Anesthesia Preprocedure Evaluation (Signed)
Anesthesia Evaluation  Patient identified by MRN, date of birth, ID band Patient awake    Reviewed: Allergy & Precautions, NPO status , Patient's Chart, lab work & pertinent test results  Airway Mallampati: II  TM Distance: >3 FB Neck ROM: Full    Dental  (+) Dental Advisory Given, Chipped, Poor Dentition   Pulmonary neg pulmonary ROS,    Pulmonary exam normal breath sounds clear to auscultation       Cardiovascular hypertension, Pt. on medications +CHF  Normal cardiovascular exam Rhythm:Regular Rate:Normal     Neuro/Psych negative neurological ROS     GI/Hepatic negative GI ROS, Neg liver ROS,   Endo/Other    Renal/GU Renal disease     Musculoskeletal negative musculoskeletal ROS (+)   Abdominal   Peds  Hematology negative hematology ROS (+)   Anesthesia Other Findings   Reproductive/Obstetrics negative OB ROS                             Anesthesia Physical Anesthesia Plan  ASA: 3  Anesthesia Plan: MAC   Post-op Pain Management:    Induction:   PONV Risk Score and Plan:   Airway Management Planned: Nasal Cannula and Natural Airway  Additional Equipment:   Intra-op Plan:   Post-operative Plan:   Informed Consent: I have reviewed the patients History and Physical, chart, labs and discussed the procedure including the risks, benefits and alternatives for the proposed anesthesia with the patient or authorized representative who has indicated his/her understanding and acceptance.     Dental advisory given  Plan Discussed with: CRNA and Surgeon  Anesthesia Plan Comments:         Anesthesia Quick Evaluation

## 2021-08-13 NOTE — Op Note (Signed)
Date of procedure: 08/13/21  Pre-operative diagnosis: Visually significant age-related nuclear cataract, Right Eye (H25.11)  Post-operative diagnosis: Visually significant age-related nuclear cataract, Right Eye  Procedure: Removal of cataract via phacoemulsification and insertion of intra-ocular lens Rayner [id number] +24.5D into the capsular bag of the Right Eye  Attending surgeon: Gerda Diss. Keegan Bensch, MD, MA  Anesthesia: MAC, Topical Akten  Complications: None  Estimated Blood Loss: <7m (minimal)  Specimens: None  Implants: As above  Indications:  Visually significant age-related cataract, Right Eye  Procedure:  The patient was seen and identified in the pre-operative area. The operative eye was identified and dilated.  The operative eye was marked.  Topical anesthesia was administered to the operative eye.     The patient was then to the operative suite and placed in the supine position.  A timeout was performed confirming the patient, procedure to be performed, and all other relevant information.   The patient's face was prepped and draped in the usual fashion for intra-ocular surgery.  A lid speculum was placed into the operative eye and the surgical microscope moved into place and focused.  A superotemporal paracentesis was created using a 20 gauge paracentesis blade.  Shugarcaine was injected into the anterior chamber.  Viscoelastic was injected into the anterior chamber.  A temporal clear-corneal main wound incision was created using a 2.453mmicrokeratome.  A continuous curvilinear capsulorrhexis was initiated using an irrigating cystitome and completed using capsulorrhexis forceps.  Hydrodissection and hydrodeliniation were performed.  Viscoelastic was injected into the anterior chamber.  A phacoemulsification handpiece and a chopper as a second instrument were used to remove the nucleus and epinucleus. The irrigation/aspiration handpiece was used to remove any remaining cortical  material.   The capsular bag was reinflated with viscoelastic, checked, and found to be intact.  The intraocular lens was inserted into the capsular bag.  The irrigation/aspiration handpiece was used to remove any remaining viscoelastic.  The clear corneal wound and paracentesis wounds were then hydrated and checked with Weck-Cels to be watertight.  The lid-speculum and drape was removed, and the patient's face was cleaned with a wet and dry 4x4.  Maxitrol was instilled in the eye before a clear shield was taped over the eye. The patient was taken to the post-operative care unit in good condition, having tolerated the procedure well.  Post-Op Instructions: [facility] and will receive all other orders and instructions.

## 2021-08-16 ENCOUNTER — Encounter (HOSPITAL_COMMUNITY): Payer: Self-pay | Admitting: Ophthalmology

## 2021-09-02 DIAGNOSIS — H2512 Age-related nuclear cataract, left eye: Secondary | ICD-10-CM | POA: Diagnosis not present

## 2021-09-07 ENCOUNTER — Other Ambulatory Visit: Payer: Self-pay

## 2021-09-07 ENCOUNTER — Encounter (HOSPITAL_COMMUNITY)
Admission: RE | Admit: 2021-09-07 | Discharge: 2021-09-07 | Disposition: A | Discharge status: Home / Self Care | Payer: Medicare Other | Source: Ambulatory Visit | Attending: Ophthalmology | Admitting: Ophthalmology

## 2021-09-08 NOTE — H&P (Signed)
Surgical History & Physical  Patient Name: Kathleen Nielsen DOB: 30-Jan-1941  Surgery: Cataract extraction with intraocular lens implant phacoemulsification; Left Eye  Surgeon: [name] MD Surgery Date:  09-10-21 Pre-Op Date:  08-19-21  HPI: [name] The patient is returning after cataract surgery. The right eye is affected. Status post cataract surgery, which began 1 week ago: Since the last visit, the affected area is doing well. The patient's vision is improved, brighter and stable. Patient is following medication instructions. PT taking PO combo drop TID OD. Pt denies any increase in floaters/flashes of light. The patient complains of difficulty when viewing TV, reading closed caption, news scrolls on TV, which began many years ago. The left eye is affected. The episode is gradual. The condition's severity increased since last visit. Symptoms occur when the patient is inside and outside.This is negatively affecting the patient's quality of life. HPI Completed by Dr. [name]  Medical History: Amblyopia OS Heart Problem High Blood Pressure LDL Pneumonia (2020)Drug interaction Benazepril/HCTZ  Review of Systems Cardiovascular Congestive Heart Failure All recorded systems are negative except as noted above.  Social   Never smoked   Medication Prednisolone-Moxifloxacin-Bromfenac, Atorvastatin, Calcium Supplement, Gabapentin, Furosemide, Carvedilol, Vitamin D3, Vitamin B-12, Folic acid,   Sx/Procedures Benazepril/HCTZ interaction, Phaco c IOL OD, C Section, Appendectomy,   Drug Allergies   NKDA  History & Physical: Heent: Cataract, Left Eye NECK: supple without bruits LUNGS: lungs clear to auscultation CV: regular rate and rhythm Abdomen: soft and non-tender  Impression & Plan: Assessment: 1.  CATARACT EXTRACTION STATUS; Right Eye (Z98.41) 2.  INTRAOCULAR LENS IOL ; Right Eye (Z96.1) 3.  NUCLEAR SCLEROSIS AGE RELATED; Left Eye (H25.12) 4.   Presbyopia ; Left Eye Both Eyes (H52.4)  Plan: 1.  1 week after cataract surgery. Doing well with improved vision and normal eye pressure. Call with any problems or concerns. Continue Pred-Moxi-Brom 2x/day for 3 more weeks.  2.  Doing well since surgery Continue Post-op medications  3.  Cataract accounts for the patient's decreased vision. This visual impairment is not correctable with a tolerable change in glasses or contact lenses. Cataract surgery with an implantation of a new lens should significantly improve the visual and functional status of the patient. Discussed all risks, benefits, alternatives, and potential complications. Discussed the procedures and recovery. Patient desires to have surgery. A-scan ordered and performed today for intra-ocular lens calculations. The surgery will be performed in order to improve vision for driving, reading, and for eye examinations. Recommend phacoemulsification with intra-ocular lens. Recommend Dextenza for post-operative pain and inflammation. Left Eye. Surgery required to correct imbalance of vision. Dilates poorly - Omidria by protocol. Omidira.  4.

## 2021-09-10 ENCOUNTER — Ambulatory Visit (HOSPITAL_COMMUNITY): Payer: Medicare Other | Admitting: Certified Registered"

## 2021-09-10 ENCOUNTER — Other Ambulatory Visit: Payer: Self-pay

## 2021-09-10 ENCOUNTER — Encounter (HOSPITAL_COMMUNITY)
Admission: RE | Disposition: A | Discharge status: Home / Self Care | Payer: Self-pay | Source: Home / Self Care | Attending: Ophthalmology

## 2021-09-10 ENCOUNTER — Encounter (HOSPITAL_COMMUNITY): Payer: Self-pay | Admitting: Ophthalmology

## 2021-09-10 ENCOUNTER — Ambulatory Visit (HOSPITAL_COMMUNITY)
Admission: RE | Admit: 2021-09-10 | Discharge: 2021-09-10 | Disposition: A | Discharge status: Home / Self Care | Payer: Medicare Other | Attending: Ophthalmology | Admitting: Ophthalmology

## 2021-09-10 DIAGNOSIS — Z9841 Cataract extraction status, right eye: Secondary | ICD-10-CM | POA: Diagnosis not present

## 2021-09-10 DIAGNOSIS — H2512 Age-related nuclear cataract, left eye: Secondary | ICD-10-CM | POA: Insufficient documentation

## 2021-09-10 DIAGNOSIS — I110 Hypertensive heart disease with heart failure: Secondary | ICD-10-CM | POA: Diagnosis not present

## 2021-09-10 DIAGNOSIS — I509 Heart failure, unspecified: Secondary | ICD-10-CM | POA: Diagnosis not present

## 2021-09-10 DIAGNOSIS — Z961 Presence of intraocular lens: Secondary | ICD-10-CM | POA: Diagnosis not present

## 2021-09-10 DIAGNOSIS — H524 Presbyopia: Secondary | ICD-10-CM | POA: Diagnosis not present

## 2021-09-10 DIAGNOSIS — N289 Disorder of kidney and ureter, unspecified: Secondary | ICD-10-CM | POA: Diagnosis not present

## 2021-09-10 HISTORY — PX: CATARACT EXTRACTION W/PHACO: SHX586

## 2021-09-10 SURGERY — PHACOEMULSIFICATION, CATARACT, WITH IOL INSERTION
Anesthesia: Monitor Anesthesia Care | Site: Eye | Laterality: Left

## 2021-09-10 MED ORDER — PHENYLEPHRINE-KETOROLAC 1-0.3 % IO SOLN
INTRAOCULAR | Status: AC
  Filled 2021-09-10: qty 4

## 2021-09-10 MED ORDER — TROPICAMIDE 1 % OP SOLN
1.0000 [drp] | OPHTHALMIC | Status: AC | PRN
  Administered 2021-09-10 (×3): 1 [drp] via OPHTHALMIC
  Filled 2021-09-10: qty 2

## 2021-09-10 MED ORDER — TETRACAINE HCL 0.5 % OP SOLN
1.0000 [drp] | OPHTHALMIC | Status: AC | PRN
  Administered 2021-09-10 (×3): 1 [drp] via OPHTHALMIC

## 2021-09-10 MED ORDER — EPINEPHRINE PF 1 MG/ML IJ SOLN
INTRAMUSCULAR | Status: AC
  Filled 2021-09-10: qty 1

## 2021-09-10 MED ORDER — SODIUM HYALURONATE 23MG/ML IO SOSY
PREFILLED_SYRINGE | INTRAOCULAR | Status: DC | PRN
  Administered 2021-09-10: 0.6 mL via INTRAOCULAR

## 2021-09-10 MED ORDER — BSS IO SOLN
INTRAOCULAR | Status: DC | PRN
  Administered 2021-09-10: 15 mL via INTRAOCULAR

## 2021-09-10 MED ORDER — SODIUM HYALURONATE 10 MG/ML IO SOLUTION
PREFILLED_SYRINGE | INTRAOCULAR | Status: DC | PRN
  Administered 2021-09-10: 0.85 mL via INTRAOCULAR

## 2021-09-10 MED ORDER — LIDOCAINE HCL 3.5 % OP GEL
1.0000 "application " | Freq: Once | OPHTHALMIC | Status: AC
  Administered 2021-09-10: 1 via OPHTHALMIC

## 2021-09-10 MED ORDER — NEOMYCIN-POLYMYXIN-DEXAMETH 3.5-10000-0.1 OP SUSP
OPHTHALMIC | Status: DC | PRN
  Administered 2021-09-10: 1 [drp] via OPHTHALMIC

## 2021-09-10 MED ORDER — LIDOCAINE HCL (PF) 1 % IJ SOLN
INTRAOCULAR | Status: DC | PRN
  Administered 2021-09-10: 1 mL via OPHTHALMIC

## 2021-09-10 MED ORDER — STERILE WATER FOR IRRIGATION IR SOLN
Status: DC | PRN
  Administered 2021-09-10: 250 mL

## 2021-09-10 MED ORDER — POVIDONE-IODINE 5 % OP SOLN
OPHTHALMIC | Status: DC | PRN
  Administered 2021-09-10: 1 via OPHTHALMIC

## 2021-09-10 MED ORDER — PHENYLEPHRINE HCL 2.5 % OP SOLN
1.0000 [drp] | OPHTHALMIC | Status: AC | PRN
  Administered 2021-09-10 (×3): 1 [drp] via OPHTHALMIC

## 2021-09-10 MED ORDER — EPINEPHRINE PF 1 MG/ML IJ SOLN
INTRAOCULAR | Status: DC | PRN
  Administered 2021-09-10: 500 mL

## 2021-09-10 SURGICAL SUPPLY — 11 items
CLOTH BEACON ORANGE TIMEOUT ST (SAFETY) ×1 IMPLANT
EYE SHIELD UNIVERSAL CLEAR (GAUZE/BANDAGES/DRESSINGS) ×1 IMPLANT
GLOVE SURG UNDER POLY LF SZ7 (GLOVE) ×2 IMPLANT
NDL HYPO 18GX1.5 BLUNT FILL (NEEDLE) IMPLANT
NEEDLE HYPO 18GX1.5 BLUNT FILL (NEEDLE) ×2 IMPLANT
PAD ARMBOARD 7.5X6 YLW CONV (MISCELLANEOUS) ×1 IMPLANT
RayOne EMV US (Intraocular Lens) ×1 IMPLANT
SYR TB 1ML LL NO SAFETY (SYRINGE) ×1 IMPLANT
TAPE SURG TRANSPORE 1 IN (GAUZE/BANDAGES/DRESSINGS) IMPLANT
TAPE SURGICAL TRANSPORE 1 IN (GAUZE/BANDAGES/DRESSINGS) ×2
WATER STERILE IRR 250ML POUR (IV SOLUTION) ×1 IMPLANT

## 2021-09-10 NOTE — Anesthesia Postprocedure Evaluation (Signed)
Anesthesia Post Note  Patient: Kathleen Nielsen  Procedure(s) Performed: CATARACT EXTRACTION PHACO AND INTRAOCULAR LENS PLACEMENT (IOC) (Left: Eye)  Patient location during evaluation: Phase II Anesthesia Type: MAC Level of consciousness: awake Pain management: pain level controlled Vital Signs Assessment: post-procedure vital signs reviewed and stable Respiratory status: spontaneous breathing and respiratory function stable Cardiovascular status: blood pressure returned to baseline and stable Postop Assessment: no headache and no apparent nausea or vomiting Anesthetic complications: no Comments: Late entry   No notable events documented.   Last Vitals:  Vitals:   09/10/21 1057 09/10/21 1150  BP: (!) 159/72 (!) 127/56  Pulse: (!) 103 69  Resp: (!) 22 19  Temp: 36.8 C   SpO2: 99% 99%    Last Pain:  Vitals:   09/10/21 1150  TempSrc:   PainSc: 0-No pain                 Louann Sjogren

## 2021-09-10 NOTE — Anesthesia Procedure Notes (Signed)
Procedure Name: MAC Date/Time: 09/10/2021 11:33 AM Performed by: Orlie Dakin, CRNA Pre-anesthesia Checklist: Emergency Drugs available, Patient identified, Suction available and Patient being monitored Patient Re-evaluated:Patient Re-evaluated prior to induction Oxygen Delivery Method: Nasal cannula Placement Confirmation: positive ETCO2

## 2021-09-10 NOTE — Discharge Instructions (Signed)
Please discharge patient when stable, will follow up today with Dr. Marquetta Weiskopf at the [facility] [place] office immediately following discharge.  Leave shield in place until visit.  All paperwork with discharge instructions will be given at the office.  [facility] [place] Address:  [address]  , [place] [address]  

## 2021-09-10 NOTE — Transfer of Care (Signed)
Immediate Anesthesia Transfer of Care Note  Patient: Kathleen Nielsen  Procedure(s) Performed: CATARACT EXTRACTION PHACO AND INTRAOCULAR LENS PLACEMENT (IOC) (Left: Eye)  Patient Location: Short Stay  Anesthesia Type:MAC  Level of Consciousness: awake, alert  and oriented  Airway & Oxygen Therapy: Patient Spontanous Breathing  Post-op Assessment: Report given to RN, Post -op Vital signs reviewed and stable and Patient moving all extremities X 4  Post vital signs: Reviewed and stable  Last Vitals:  Vitals Value Taken Time  BP    Temp    Pulse    Resp    SpO2      Last Pain:  Vitals:   09/10/21 1057  TempSrc: Oral  PainSc: 0-No pain         Complications: No notable events documented.

## 2021-09-10 NOTE — Interval H&P Note (Signed)
History and Physical Interval Note:  09/10/2021 11:25 AM  Kathleen Nielsen  has presented today for surgery, with the diagnosis of nuclear cataract left eye.  The various methods of treatment have been discussed with the patient and family. After consideration of risks, benefits and other options for treatment, the patient has consented to  Procedure(s) with comments: CATARACT EXTRACTION PHACO AND INTRAOCULAR LENS PLACEMENT (IOC) (Left) - CDE as a surgical intervention.  The patient's history has been reviewed, patient examined, no change in status, stable for surgery.  I have reviewed the patient's chart and labs.  Questions were answered to the patient's satisfaction.     [name]

## 2021-09-10 NOTE — Op Note (Signed)
Date of procedure: 09/10/21  Pre-operative diagnosis: Visually significant age-related nuclear cataract, Left Eye (H25.12)  Post-operative diagnosis: Visually significant age-related nuclear cataract, Left Eye  Procedure: Removal of cataract via phacoemulsification and insertion of intra-ocular lens Rayner [id number] +24.5D into the capsular bag of the Left Eye  Attending surgeon: Gerda Diss. Suzi Hernan, MD, MA  Anesthesia: MAC, Topical Akten  Complications: None  Estimated Blood Loss: <5m (minimal)  Specimens: None  Implants: As above  Indications:  Visually significant age-related cataract, Left Eye  Procedure:  The patient was seen and identified in the pre-operative area. The operative eye was identified and dilated.  The operative eye was marked.  Topical anesthesia was administered to the operative eye.     The patient was then to the operative suite and placed in the supine position.  A timeout was performed confirming the patient, procedure to be performed, and all other relevant information.   The patient's face was prepped and draped in the usual fashion for intra-ocular surgery.  A lid speculum was placed into the operative eye and the surgical microscope moved into place and focused.  An inferotemporal paracentesis was created using a 20 gauge paracentesis blade.  Shugarcaine was injected into the anterior chamber.  Viscoelastic was injected into the anterior chamber.  A temporal clear-corneal main wound incision was created using a 2.43mmicrokeratome.  A continuous curvilinear capsulorrhexis was initiated using an irrigating cystitome and completed using capsulorrhexis forceps.  Hydrodissection and hydrodeliniation were performed.  Viscoelastic was injected into the anterior chamber.  A phacoemulsification handpiece and a chopper as a second instrument were used to remove the nucleus and epinucleus. The irrigation/aspiration handpiece was used to remove any remaining cortical material.    The capsular bag was reinflated with viscoelastic, checked, and found to be intact.  The intraocular lens was inserted into the capsular bag.  The irrigation/aspiration handpiece was used to remove any remaining viscoelastic.  The clear corneal wound and paracentesis wounds were then hydrated and checked with Weck-Cels to be watertight.  The lid-speculum and drape was removed, and the patient's face was cleaned with a wet and dry 4x4.  Maxitrol was instilled in the eye before a clear shield was taped over the eye. The patient was taken to the post-operative care unit in good condition, having tolerated the procedure well.  Post-Op Instructions: [facility] and will receive all other orders and instructions.

## 2021-09-10 NOTE — Anesthesia Preprocedure Evaluation (Signed)
Anesthesia Evaluation  Patient identified by MRN, date of birth, ID band Patient awake    Reviewed: Allergy & Precautions, H&P , NPO status , Patient's Chart, lab work & pertinent test results, reviewed documented beta blocker date and time   Airway Mallampati: II  TM Distance: >3 FB Neck ROM: Full    Dental no notable dental hx. (+) Dental Advisory Given, Chipped, Poor Dentition   Pulmonary neg pulmonary ROS,    Pulmonary exam normal breath sounds clear to auscultation       Cardiovascular Exercise Tolerance: Good hypertension, Pt. on medications +CHF  negative cardio ROS Normal cardiovascular exam Rhythm:Regular Rate:Normal     Neuro/Psych negative neurological ROS  negative psych ROS   GI/Hepatic negative GI ROS, Neg liver ROS,   Endo/Other  negative endocrine ROS  Renal/GU Renal diseasenegative Renal ROS  negative genitourinary   Musculoskeletal negative musculoskeletal ROS (+)   Abdominal   Peds  Hematology negative hematology ROS (+)   Anesthesia Other Findings   Reproductive/Obstetrics negative OB ROS                             Anesthesia Physical  Anesthesia Plan  ASA: 3  Anesthesia Plan: MAC   Post-op Pain Management:    Induction:   PONV Risk Score and Plan:   Airway Management Planned: Nasal Cannula and Natural Airway  Additional Equipment:   Intra-op Plan:   Post-operative Plan:   Informed Consent: I have reviewed the patients History and Physical, chart, labs and discussed the procedure including the risks, benefits and alternatives for the proposed anesthesia with the patient or authorized representative who has indicated his/her understanding and acceptance.     Dental advisory given  Plan Discussed with: CRNA and Surgeon  Anesthesia Plan Comments:         Anesthesia Quick Evaluation

## 2021-09-13 ENCOUNTER — Encounter (HOSPITAL_COMMUNITY): Payer: Self-pay | Admitting: Ophthalmology

## 2021-09-13 DIAGNOSIS — Z23 Encounter for immunization: Secondary | ICD-10-CM | POA: Diagnosis not present

## 2021-09-13 DIAGNOSIS — I5032 Chronic diastolic (congestive) heart failure: Secondary | ICD-10-CM | POA: Diagnosis not present

## 2021-09-13 DIAGNOSIS — I10 Essential (primary) hypertension: Secondary | ICD-10-CM | POA: Diagnosis not present

## 2021-09-13 DIAGNOSIS — N1831 Chronic kidney disease, stage 3a: Secondary | ICD-10-CM | POA: Diagnosis not present

## 2021-09-13 DIAGNOSIS — G629 Polyneuropathy, unspecified: Secondary | ICD-10-CM | POA: Diagnosis not present

## 2021-10-22 DIAGNOSIS — Z23 Encounter for immunization: Secondary | ICD-10-CM | POA: Diagnosis not present

## 2021-12-13 DIAGNOSIS — G629 Polyneuropathy, unspecified: Secondary | ICD-10-CM | POA: Diagnosis not present

## 2021-12-13 DIAGNOSIS — I10 Essential (primary) hypertension: Secondary | ICD-10-CM | POA: Diagnosis not present

## 2021-12-13 DIAGNOSIS — N1831 Chronic kidney disease, stage 3a: Secondary | ICD-10-CM | POA: Diagnosis not present

## 2021-12-13 DIAGNOSIS — I5032 Chronic diastolic (congestive) heart failure: Secondary | ICD-10-CM | POA: Diagnosis not present

## 2022-01-27 DIAGNOSIS — M8589 Other specified disorders of bone density and structure, multiple sites: Secondary | ICD-10-CM | POA: Diagnosis not present

## 2022-01-27 DIAGNOSIS — M810 Age-related osteoporosis without current pathological fracture: Secondary | ICD-10-CM | POA: Diagnosis not present

## 2022-03-15 DIAGNOSIS — I5032 Chronic diastolic (congestive) heart failure: Secondary | ICD-10-CM | POA: Diagnosis not present

## 2022-03-15 DIAGNOSIS — I10 Essential (primary) hypertension: Secondary | ICD-10-CM | POA: Diagnosis not present

## 2022-03-15 DIAGNOSIS — G629 Polyneuropathy, unspecified: Secondary | ICD-10-CM | POA: Diagnosis not present

## 2022-03-15 DIAGNOSIS — N1831 Chronic kidney disease, stage 3a: Secondary | ICD-10-CM | POA: Diagnosis not present

## 2022-06-15 DIAGNOSIS — Z1331 Encounter for screening for depression: Secondary | ICD-10-CM | POA: Diagnosis not present

## 2022-06-15 DIAGNOSIS — G629 Polyneuropathy, unspecified: Secondary | ICD-10-CM | POA: Diagnosis not present

## 2022-06-15 DIAGNOSIS — I10 Essential (primary) hypertension: Secondary | ICD-10-CM | POA: Diagnosis not present

## 2022-06-15 DIAGNOSIS — N1831 Chronic kidney disease, stage 3a: Secondary | ICD-10-CM | POA: Diagnosis not present

## 2022-06-15 DIAGNOSIS — Z0000 Encounter for general adult medical examination without abnormal findings: Secondary | ICD-10-CM | POA: Diagnosis not present

## 2022-06-15 DIAGNOSIS — I5032 Chronic diastolic (congestive) heart failure: Secondary | ICD-10-CM | POA: Diagnosis not present

## 2022-08-04 DIAGNOSIS — Z23 Encounter for immunization: Secondary | ICD-10-CM | POA: Diagnosis not present

## 2022-09-21 DIAGNOSIS — N1831 Chronic kidney disease, stage 3a: Secondary | ICD-10-CM | POA: Diagnosis not present

## 2022-09-21 DIAGNOSIS — G629 Polyneuropathy, unspecified: Secondary | ICD-10-CM | POA: Diagnosis not present

## 2022-09-21 DIAGNOSIS — I5032 Chronic diastolic (congestive) heart failure: Secondary | ICD-10-CM | POA: Diagnosis not present

## 2022-09-21 DIAGNOSIS — Z23 Encounter for immunization: Secondary | ICD-10-CM | POA: Diagnosis not present

## 2022-09-21 DIAGNOSIS — I10 Essential (primary) hypertension: Secondary | ICD-10-CM | POA: Diagnosis not present

## 2022-12-22 DIAGNOSIS — G629 Polyneuropathy, unspecified: Secondary | ICD-10-CM | POA: Diagnosis not present

## 2022-12-22 DIAGNOSIS — I5032 Chronic diastolic (congestive) heart failure: Secondary | ICD-10-CM | POA: Diagnosis not present

## 2022-12-22 DIAGNOSIS — N1831 Chronic kidney disease, stage 3a: Secondary | ICD-10-CM | POA: Diagnosis not present

## 2022-12-22 DIAGNOSIS — I10 Essential (primary) hypertension: Secondary | ICD-10-CM | POA: Diagnosis not present

## 2023-03-22 DIAGNOSIS — N1831 Chronic kidney disease, stage 3a: Secondary | ICD-10-CM | POA: Diagnosis not present

## 2023-03-22 DIAGNOSIS — G629 Polyneuropathy, unspecified: Secondary | ICD-10-CM | POA: Diagnosis not present

## 2023-03-22 DIAGNOSIS — I10 Essential (primary) hypertension: Secondary | ICD-10-CM | POA: Diagnosis not present

## 2023-03-22 DIAGNOSIS — I5032 Chronic diastolic (congestive) heart failure: Secondary | ICD-10-CM | POA: Diagnosis not present

## 2023-06-21 DIAGNOSIS — Z0000 Encounter for general adult medical examination without abnormal findings: Secondary | ICD-10-CM | POA: Diagnosis not present

## 2023-06-21 DIAGNOSIS — N1831 Chronic kidney disease, stage 3a: Secondary | ICD-10-CM | POA: Diagnosis not present

## 2023-06-21 DIAGNOSIS — I5032 Chronic diastolic (congestive) heart failure: Secondary | ICD-10-CM | POA: Diagnosis not present

## 2023-06-21 DIAGNOSIS — Z1331 Encounter for screening for depression: Secondary | ICD-10-CM | POA: Diagnosis not present

## 2023-06-21 DIAGNOSIS — I10 Essential (primary) hypertension: Secondary | ICD-10-CM | POA: Diagnosis not present

## 2023-06-21 DIAGNOSIS — G629 Polyneuropathy, unspecified: Secondary | ICD-10-CM | POA: Diagnosis not present

## 2023-09-12 DIAGNOSIS — Z23 Encounter for immunization: Secondary | ICD-10-CM | POA: Diagnosis not present

## 2023-09-27 DIAGNOSIS — G629 Polyneuropathy, unspecified: Secondary | ICD-10-CM | POA: Diagnosis not present

## 2023-09-27 DIAGNOSIS — I10 Essential (primary) hypertension: Secondary | ICD-10-CM | POA: Diagnosis not present

## 2023-09-27 DIAGNOSIS — I5032 Chronic diastolic (congestive) heart failure: Secondary | ICD-10-CM | POA: Diagnosis not present

## 2023-09-27 DIAGNOSIS — N1832 Chronic kidney disease, stage 3b: Secondary | ICD-10-CM | POA: Diagnosis not present

## 2023-11-02 DIAGNOSIS — Z23 Encounter for immunization: Secondary | ICD-10-CM | POA: Diagnosis not present

## 2023-12-27 DIAGNOSIS — G629 Polyneuropathy, unspecified: Secondary | ICD-10-CM | POA: Diagnosis not present

## 2023-12-27 DIAGNOSIS — I5032 Chronic diastolic (congestive) heart failure: Secondary | ICD-10-CM | POA: Diagnosis not present

## 2023-12-27 DIAGNOSIS — I10 Essential (primary) hypertension: Secondary | ICD-10-CM | POA: Diagnosis not present

## 2023-12-27 DIAGNOSIS — N1832 Chronic kidney disease, stage 3b: Secondary | ICD-10-CM | POA: Diagnosis not present

## 2024-04-03 DIAGNOSIS — N1831 Chronic kidney disease, stage 3a: Secondary | ICD-10-CM | POA: Diagnosis not present

## 2024-04-03 DIAGNOSIS — I10 Essential (primary) hypertension: Secondary | ICD-10-CM | POA: Diagnosis not present

## 2024-04-03 DIAGNOSIS — G629 Polyneuropathy, unspecified: Secondary | ICD-10-CM | POA: Diagnosis not present

## 2024-04-03 DIAGNOSIS — I5032 Chronic diastolic (congestive) heart failure: Secondary | ICD-10-CM | POA: Diagnosis not present

## 2024-07-04 DIAGNOSIS — R5383 Other fatigue: Secondary | ICD-10-CM | POA: Diagnosis not present

## 2024-07-04 DIAGNOSIS — G629 Polyneuropathy, unspecified: Secondary | ICD-10-CM | POA: Diagnosis not present

## 2024-07-04 DIAGNOSIS — I5032 Chronic diastolic (congestive) heart failure: Secondary | ICD-10-CM | POA: Diagnosis not present

## 2024-07-04 DIAGNOSIS — I10 Essential (primary) hypertension: Secondary | ICD-10-CM | POA: Diagnosis not present

## 2024-07-04 DIAGNOSIS — N1831 Chronic kidney disease, stage 3a: Secondary | ICD-10-CM | POA: Diagnosis not present

## 2024-07-16 DIAGNOSIS — I5032 Chronic diastolic (congestive) heart failure: Secondary | ICD-10-CM | POA: Diagnosis not present

## 2024-07-16 DIAGNOSIS — N1831 Chronic kidney disease, stage 3a: Secondary | ICD-10-CM | POA: Diagnosis not present

## 2024-07-31 DIAGNOSIS — H52223 Regular astigmatism, bilateral: Secondary | ICD-10-CM | POA: Diagnosis not present

## 2024-07-31 DIAGNOSIS — H35341 Macular cyst, hole, or pseudohole, right eye: Secondary | ICD-10-CM | POA: Diagnosis not present

## 2024-07-31 DIAGNOSIS — H26493 Other secondary cataract, bilateral: Secondary | ICD-10-CM | POA: Diagnosis not present

## 2024-07-31 DIAGNOSIS — H524 Presbyopia: Secondary | ICD-10-CM | POA: Diagnosis not present

## 2024-07-31 DIAGNOSIS — H35373 Puckering of macula, bilateral: Secondary | ICD-10-CM | POA: Diagnosis not present

## 2024-07-31 DIAGNOSIS — H5203 Hypermetropia, bilateral: Secondary | ICD-10-CM | POA: Diagnosis not present

## 2024-07-31 DIAGNOSIS — H353131 Nonexudative age-related macular degeneration, bilateral, early dry stage: Secondary | ICD-10-CM | POA: Diagnosis not present

## 2024-08-27 DIAGNOSIS — N1831 Chronic kidney disease, stage 3a: Secondary | ICD-10-CM | POA: Diagnosis not present

## 2024-08-27 DIAGNOSIS — I5032 Chronic diastolic (congestive) heart failure: Secondary | ICD-10-CM | POA: Diagnosis not present

## 2024-09-02 DIAGNOSIS — H35373 Puckering of macula, bilateral: Secondary | ICD-10-CM | POA: Diagnosis not present

## 2024-09-02 DIAGNOSIS — H35363 Drusen (degenerative) of macula, bilateral: Secondary | ICD-10-CM | POA: Diagnosis not present

## 2024-09-16 DIAGNOSIS — H26493 Other secondary cataract, bilateral: Secondary | ICD-10-CM | POA: Diagnosis not present

## 2024-10-01 DIAGNOSIS — R0602 Shortness of breath: Secondary | ICD-10-CM | POA: Diagnosis not present

## 2024-10-01 DIAGNOSIS — Z1152 Encounter for screening for COVID-19: Secondary | ICD-10-CM | POA: Diagnosis not present

## 2024-10-01 DIAGNOSIS — I609 Nontraumatic subarachnoid hemorrhage, unspecified: Secondary | ICD-10-CM | POA: Diagnosis not present

## 2024-10-01 DIAGNOSIS — Z20822 Contact with and (suspected) exposure to covid-19: Secondary | ICD-10-CM | POA: Diagnosis not present

## 2024-10-01 DIAGNOSIS — J8410 Pulmonary fibrosis, unspecified: Secondary | ICD-10-CM | POA: Diagnosis not present

## 2024-10-01 DIAGNOSIS — R531 Weakness: Secondary | ICD-10-CM | POA: Diagnosis not present

## 2024-10-01 DIAGNOSIS — R0989 Other specified symptoms and signs involving the circulatory and respiratory systems: Secondary | ICD-10-CM | POA: Diagnosis not present

## 2024-10-01 DIAGNOSIS — Z9981 Dependence on supplemental oxygen: Secondary | ICD-10-CM | POA: Diagnosis not present

## 2024-10-01 DIAGNOSIS — Z743 Need for continuous supervision: Secondary | ICD-10-CM | POA: Diagnosis not present

## 2024-10-01 DIAGNOSIS — R2681 Unsteadiness on feet: Secondary | ICD-10-CM | POA: Diagnosis not present

## 2024-10-01 DIAGNOSIS — R0600 Dyspnea, unspecified: Secondary | ICD-10-CM | POA: Diagnosis not present

## 2024-10-01 DIAGNOSIS — N3000 Acute cystitis without hematuria: Secondary | ICD-10-CM | POA: Diagnosis not present

## 2024-10-02 DIAGNOSIS — I2510 Atherosclerotic heart disease of native coronary artery without angina pectoris: Secondary | ICD-10-CM | POA: Diagnosis not present

## 2024-10-02 DIAGNOSIS — Z0189 Encounter for other specified special examinations: Secondary | ICD-10-CM | POA: Diagnosis not present

## 2024-10-02 DIAGNOSIS — J849 Interstitial pulmonary disease, unspecified: Secondary | ICD-10-CM | POA: Diagnosis not present

## 2024-10-02 DIAGNOSIS — J189 Pneumonia, unspecified organism: Secondary | ICD-10-CM | POA: Diagnosis not present

## 2024-10-02 DIAGNOSIS — E039 Hypothyroidism, unspecified: Secondary | ICD-10-CM | POA: Diagnosis not present

## 2024-10-02 DIAGNOSIS — R7989 Other specified abnormal findings of blood chemistry: Secondary | ICD-10-CM | POA: Diagnosis not present

## 2024-10-02 DIAGNOSIS — E46 Unspecified protein-calorie malnutrition: Secondary | ICD-10-CM | POA: Diagnosis not present

## 2024-10-02 DIAGNOSIS — I9589 Other hypotension: Secondary | ICD-10-CM | POA: Diagnosis not present

## 2024-10-02 DIAGNOSIS — N179 Acute kidney failure, unspecified: Secondary | ICD-10-CM | POA: Diagnosis not present

## 2024-10-02 DIAGNOSIS — J8410 Pulmonary fibrosis, unspecified: Secondary | ICD-10-CM | POA: Diagnosis not present

## 2024-10-02 DIAGNOSIS — I272 Pulmonary hypertension, unspecified: Secondary | ICD-10-CM | POA: Diagnosis not present

## 2024-10-02 DIAGNOSIS — D72829 Elevated white blood cell count, unspecified: Secondary | ICD-10-CM | POA: Diagnosis not present

## 2024-10-02 DIAGNOSIS — I609 Nontraumatic subarachnoid hemorrhage, unspecified: Secondary | ICD-10-CM | POA: Diagnosis not present

## 2024-10-03 DIAGNOSIS — J8410 Pulmonary fibrosis, unspecified: Secondary | ICD-10-CM | POA: Diagnosis not present

## 2024-10-03 DIAGNOSIS — J849 Interstitial pulmonary disease, unspecified: Secondary | ICD-10-CM | POA: Diagnosis not present

## 2024-10-03 DIAGNOSIS — I9589 Other hypotension: Secondary | ICD-10-CM | POA: Diagnosis not present

## 2024-10-03 DIAGNOSIS — I609 Nontraumatic subarachnoid hemorrhage, unspecified: Secondary | ICD-10-CM | POA: Diagnosis not present

## 2024-10-03 DIAGNOSIS — I2510 Atherosclerotic heart disease of native coronary artery without angina pectoris: Secondary | ICD-10-CM | POA: Diagnosis not present

## 2024-10-03 DIAGNOSIS — J189 Pneumonia, unspecified organism: Secondary | ICD-10-CM | POA: Diagnosis not present

## 2024-10-03 DIAGNOSIS — D72829 Elevated white blood cell count, unspecified: Secondary | ICD-10-CM | POA: Diagnosis not present

## 2024-10-03 DIAGNOSIS — I272 Pulmonary hypertension, unspecified: Secondary | ICD-10-CM | POA: Diagnosis not present

## 2024-10-03 DIAGNOSIS — E46 Unspecified protein-calorie malnutrition: Secondary | ICD-10-CM | POA: Diagnosis not present

## 2024-10-03 DIAGNOSIS — E039 Hypothyroidism, unspecified: Secondary | ICD-10-CM | POA: Diagnosis not present

## 2024-10-03 DIAGNOSIS — R7989 Other specified abnormal findings of blood chemistry: Secondary | ICD-10-CM | POA: Diagnosis not present

## 2024-10-03 DIAGNOSIS — N179 Acute kidney failure, unspecified: Secondary | ICD-10-CM | POA: Diagnosis not present

## 2024-10-04 DIAGNOSIS — N3000 Acute cystitis without hematuria: Secondary | ICD-10-CM | POA: Diagnosis not present

## 2024-10-04 DIAGNOSIS — R579 Shock, unspecified: Secondary | ICD-10-CM | POA: Diagnosis not present

## 2024-10-04 DIAGNOSIS — Z7409 Other reduced mobility: Secondary | ICD-10-CM | POA: Diagnosis not present

## 2024-10-04 DIAGNOSIS — J189 Pneumonia, unspecified organism: Secondary | ICD-10-CM | POA: Diagnosis not present

## 2024-10-04 DIAGNOSIS — I10 Essential (primary) hypertension: Secondary | ICD-10-CM | POA: Diagnosis not present

## 2024-10-04 DIAGNOSIS — Z8673 Personal history of transient ischemic attack (TIA), and cerebral infarction without residual deficits: Secondary | ICD-10-CM | POA: Diagnosis not present

## 2024-10-04 DIAGNOSIS — R1310 Dysphagia, unspecified: Secondary | ICD-10-CM | POA: Diagnosis not present

## 2024-10-04 DIAGNOSIS — E785 Hyperlipidemia, unspecified: Secondary | ICD-10-CM | POA: Diagnosis not present

## 2024-10-05 DIAGNOSIS — I10 Essential (primary) hypertension: Secondary | ICD-10-CM | POA: Diagnosis not present

## 2024-10-05 DIAGNOSIS — Z7409 Other reduced mobility: Secondary | ICD-10-CM | POA: Diagnosis not present

## 2024-10-05 DIAGNOSIS — N3000 Acute cystitis without hematuria: Secondary | ICD-10-CM | POA: Diagnosis not present

## 2024-10-06 DIAGNOSIS — N3000 Acute cystitis without hematuria: Secondary | ICD-10-CM | POA: Diagnosis not present

## 2024-10-06 DIAGNOSIS — Z7409 Other reduced mobility: Secondary | ICD-10-CM | POA: Diagnosis not present

## 2024-10-06 DIAGNOSIS — I10 Essential (primary) hypertension: Secondary | ICD-10-CM | POA: Diagnosis not present

## 2024-10-07 DIAGNOSIS — I10 Essential (primary) hypertension: Secondary | ICD-10-CM | POA: Diagnosis not present

## 2024-10-07 DIAGNOSIS — Z7409 Other reduced mobility: Secondary | ICD-10-CM | POA: Diagnosis not present

## 2024-10-07 DIAGNOSIS — N3000 Acute cystitis without hematuria: Secondary | ICD-10-CM | POA: Diagnosis not present

## 2024-10-08 DIAGNOSIS — N3000 Acute cystitis without hematuria: Secondary | ICD-10-CM | POA: Diagnosis not present

## 2024-10-09 DIAGNOSIS — N3000 Acute cystitis without hematuria: Secondary | ICD-10-CM | POA: Diagnosis not present
